# Patient Record
Sex: Male | Born: 1955 | Race: White | Hispanic: No | Smoking: Former smoker
Health system: Southern US, Community
[De-identification: ages and names within clinical notes are randomized; demographics above are authoritative.]

## PROBLEM LIST (undated history)

## (undated) DIAGNOSIS — M1990 Unspecified osteoarthritis, unspecified site: Secondary | ICD-10-CM

## (undated) DIAGNOSIS — I10 Essential (primary) hypertension: Secondary | ICD-10-CM

## (undated) DIAGNOSIS — I5022 Chronic systolic (congestive) heart failure: Secondary | ICD-10-CM

## (undated) DIAGNOSIS — R0600 Dyspnea, unspecified: Secondary | ICD-10-CM

## (undated) DIAGNOSIS — R011 Cardiac murmur, unspecified: Secondary | ICD-10-CM

## (undated) DIAGNOSIS — Z953 Presence of xenogenic heart valve: Secondary | ICD-10-CM

## (undated) DIAGNOSIS — I219 Acute myocardial infarction, unspecified: Secondary | ICD-10-CM

## (undated) DIAGNOSIS — I2510 Atherosclerotic heart disease of native coronary artery without angina pectoris: Secondary | ICD-10-CM

## (undated) DIAGNOSIS — Z87442 Personal history of urinary calculi: Secondary | ICD-10-CM

## (undated) DIAGNOSIS — I350 Nonrheumatic aortic (valve) stenosis: Secondary | ICD-10-CM

## (undated) HISTORY — DX: Chronic systolic (congestive) heart failure: I50.22

## (undated) HISTORY — DX: Acute myocardial infarction, unspecified: I21.9

## (undated) HISTORY — DX: Nonrheumatic aortic (valve) stenosis: I35.0

## (undated) HISTORY — DX: Atherosclerotic heart disease of native coronary artery without angina pectoris: I25.10

---

## 2001-12-04 DIAGNOSIS — I219 Acute myocardial infarction, unspecified: Secondary | ICD-10-CM

## 2001-12-04 HISTORY — PX: CORONARY ANGIOPLASTY: SHX604

## 2001-12-04 HISTORY — DX: Acute myocardial infarction, unspecified: I21.9

## 2002-03-06 ENCOUNTER — Inpatient Hospital Stay (HOSPITAL_COMMUNITY): Admission: EM | Admit: 2002-03-06 | Discharge: 2002-03-09 | Payer: Self-pay | Admitting: Internal Medicine

## 2017-05-02 ENCOUNTER — Encounter: Payer: Self-pay | Admitting: *Deleted

## 2017-05-02 ENCOUNTER — Ambulatory Visit (INDEPENDENT_AMBULATORY_CARE_PROVIDER_SITE_OTHER): Payer: BLUE CROSS/BLUE SHIELD | Admitting: Cardiovascular Disease

## 2017-05-02 ENCOUNTER — Other Ambulatory Visit: Payer: Self-pay | Admitting: Cardiovascular Disease

## 2017-05-02 ENCOUNTER — Encounter: Payer: Self-pay | Admitting: Cardiovascular Disease

## 2017-05-02 ENCOUNTER — Telehealth: Payer: Self-pay | Admitting: Cardiovascular Disease

## 2017-05-02 VITALS — BP 122/86 | HR 93 | Ht 70.0 in | Wt 201.2 lb

## 2017-05-02 DIAGNOSIS — I5022 Chronic systolic (congestive) heart failure: Secondary | ICD-10-CM

## 2017-05-02 DIAGNOSIS — I10 Essential (primary) hypertension: Secondary | ICD-10-CM

## 2017-05-02 DIAGNOSIS — Z01812 Encounter for preprocedural laboratory examination: Secondary | ICD-10-CM

## 2017-05-02 DIAGNOSIS — Z955 Presence of coronary angioplasty implant and graft: Secondary | ICD-10-CM

## 2017-05-02 DIAGNOSIS — I25118 Atherosclerotic heart disease of native coronary artery with other forms of angina pectoris: Secondary | ICD-10-CM | POA: Diagnosis not present

## 2017-05-02 DIAGNOSIS — I350 Nonrheumatic aortic (valve) stenosis: Secondary | ICD-10-CM | POA: Diagnosis not present

## 2017-05-02 MED ORDER — ASPIRIN EC 81 MG PO TBEC: 81.0000 mg | DELAYED_RELEASE_TABLET | Freq: Every day | ORAL | Status: DC

## 2017-05-02 NOTE — Telephone Encounter (Signed)
Wednesday, 6/6 -8:30 - Excell Seltzerooper

## 2017-05-02 NOTE — Patient Instructions (Addendum)
Medication Instructions:   Decrease Aspirin to 81mg  daily.   Continue all other current medications.  Labwork: BMET, CBC, PT, PTT   Testing/Procedures:  A chest x-ray takes a picture of the organs and structures inside the chest, including the heart, lungs, and blood vessels. This test can show several things, including, whether the heart is enlarges; whether fluid is building up in the lungs; and whether pacemaker / defibrillator leads are still in place.  Your physician has requested that you have a cardiac catheterization. Cardiac catheterization is used to diagnose and/or treat various heart conditions. Doctors may recommend this procedure for a number of different reasons. The most common reason is to evaluate chest pain. Chest pain can be a symptom of coronary artery disease (CAD), and cardiac catheterization can show whether plaque is narrowing or blocking your heart's arteries. This procedure is also used to evaluate the valves, as well as measure the blood flow and oxygen levels in different parts of your heart. For further information please visit [email or web address]. Please follow instruction sheet, as given.  Follow-Up: 6 weeks   Any Other Special Instructions Will Be Listed Below (If Applicable). You have been referred to:  [facility] - Dr. Cornelius Moraswen  If you need a refill on your cardiac medications before your next appointment, please call your pharmacy.

## 2017-05-02 NOTE — Progress Notes (Signed)
CARDIOLOGY CONSULT NOTE  Patient ID: Eugene InglesDoyle J Lilley MRN: [id number] DOB/AGE: October 01, 1956 61 y.o.  Admit date: (Not on file) Primary Physician: Richardean Chimeraaniel, Terry G, MD Referring Physician: Reuel Boomaniel  Reason for Consultation: aortic stenosis, CHF, CAD  HPI: Eugene Frazier is a 61 y.o. male who is being seen today for the evaluation of aortic stenosis, CAD, and CHF at the request of Richardean Chimeraaniel, Terry G, MD.   He underwent [facility]17 [facility]. I reviewed the report. It demonstrated moderately reduced left ventricular systolic function, LVEF 30-35%, mild left ventricular dilatation, mild LVH, grade 3 diastolic dysfunction, elevated left ventricular filling pressures, mild left atrial dilatation, moderately reduced right ventricular systolic function, severe bicuspid aortic valve stenosis with no evidence of regurgitation, mean gradient 30 mmHg, valve area 0.5 cm, mild mitral regurgitation, mildly elevated pulmonary pressures of 37 mmHg, with no dilatation of the ascending aorta. This was interpreted by Dr. Joneen Roachody Deen.  ECG performed in the office today which I ordered an personally interpreted demonstrated sinus rhythm with a nonspecific ST segment and T-wave abnormality in leads V5 and V6.  He tells me he had a myocardial infarction and received "2 stents to the back of his heart "about 17 years ago. He does not have a card and I do not have a cath report.  He had not seen his primary care physician for several years but began to experience progressive exertional dyspnea, bilateral leg swelling, and abdominal distention. He then saw Dr. Reuel Boomaniel who evaluated him and ordered the aforementioned echocardiogram. He was then started on Lasix 40 mg daily and is feeling much better.  He says if he walks 50 yards vigorously he has exertional dyspnea. He denies chest pain, lightheadedness, dizziness, and syncope. He also denies orthopnea and paroxysmal nocturnal dyspnea. His primary  complaint is that his legs are itchy with resolution of leg edema.  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  He is reportedly intolerant to statins with significant shoulder weakness and pain and was most recently taking them in 2010.      Allergies  Allergen Reactions  . Penicillins Hives    Current Outpatient Prescriptions  Medication Sig Dispense Refill  . aspirin 325 MG tablet Take 325 mg by mouth daily.    . furosemide (LASIX) 40 MG tablet Take 40 mg by mouth.    . metoprolol succinate (TOPROL-XL) 25 MG 24 hr tablet metoprolol succinate ER 25 mg tablet,extended release 24 hr     No current facility-administered medications for this visit.     Past Medical History:  Diagnosis Date  . Aortic stenosis     Past Surgical History:  Procedure Laterality Date  . CORONARY ANGIOPLASTY      Social History   Social History  . Marital status: Married    Spouse name: N/A  . Number of children: N/A  . Years of education: N/A   Occupational History  . Not on file.   Social History Main Topics  . Smoking status: Former Smoker    Quit date: 05/02/1997  . Smokeless tobacco: Never Used  . Alcohol use Not on file  . Drug use: Unknown  . Sexual activity: Not on file   Other Topics Concern  . Not on file   Social History Narrative  . No narrative on file     No family history of premature CAD in 1st  degree relatives.  Current Meds  Medication Sig  . aspirin 325 MG tablet Take 325 mg by mouth daily.  . furosemide (LASIX) 40 MG tablet Take 40 mg by mouth.  . metoprolol succinate (TOPROL-XL) 25 MG 24 hr tablet metoprolol succinate ER 25 mg tablet,extended release 24 hr      Review of systems complete and found to be negative unless listed above in HPI    Physical exam Blood pressure 122/86, pulse 93, height 5\' 10"  (1.778 m), weight 201 lb 3.2 oz (91.3 kg), SpO2 98 %. General: NAD Neck: JVP 12  cm, no thyromegaly or thyroid nodule.  Lungs: Clear to auscultation bilaterally with normal respiratory effort. CV: Nondisplaced PMI. Regular rate and rhythm, normal S1/S2, no S3/S4, III/VI late peaking ejection systolic murmur.  Trace bilateral pretibial edema.  No carotid bruit.    Abdomen: Soft, nontender, no distention.  Skin: Lower extremity scaling b/l.  Neurologic: Alert and oriented x 3.  Psych: Normal affect. Extremities: No clubbing or cyanosis.  HEENT: Normal.   ECG: Most recent ECG reviewed.   Labs: No results found for: K, BUN, CREATININE, ALT, TSH, HGB   Lipids: No results found for: LDLCALC, LDLDIRECT, CHOL, TRIG, HDL      ASSESSMENT AND PLAN:  1. Severe reportedly bicuspid aortic valve stenosis with moderately reduced left ventricular systolic function, LVEF 30-35%: The aortic valve will need further characterization. He will require right and left heart catheterization which I will arrange, TEE, and will need to be evaluated for valve replacement surgery and CABG only if necessary. I will make a referral to CT surgery (Dr. Cornelius Moras). Risks and benefits of cardiac catheterization have been discussed with the patient.  These include bleeding, infection, kidney damage, stroke, heart attack, death.  The patient understands these risks and is willing to proceed.  2. Chronic systolic heart failure, LVEF 30-35%: He currently appears euvolemic on Lasix 40 mg daily. He is also on metoprolol succinate 25 mg daily. No changes.  3. Coronary artery disease: As he will ultimately require valve replacement surgery if he indeed has severe aortic stenosis, I will arrange for right and left heart catheterization. He is on 325 mg of aspirin which I will reduce to 81 mg for now. Continue metoprolol succinate. He is statin intolerant. Risks and benefits of cardiac catheterization have been discussed with the patient.  These include bleeding, infection, kidney damage, stroke, heart attack, death.   The patient understands these risks and is willing to proceed.  4. Hypertension: Controlled on Toprol-XL. No changes to therapy.   Disposition: Follow up in 6 weeks.  Signed: Prentice Docker, M.D., F.A.C.C.  05/02/2017, 9:21 AM

## 2017-05-02 NOTE — Telephone Encounter (Signed)
Right & Left heart cath

## 2017-05-03 ENCOUNTER — Ambulatory Visit (HOSPITAL_COMMUNITY)
Admission: RE | Admit: 2017-05-03 | Discharge: 2017-05-03 | Disposition: A | Discharge status: Home / Self Care | Payer: BLUE CROSS/BLUE SHIELD | Source: Ambulatory Visit | Attending: Cardiovascular Disease | Admitting: Cardiovascular Disease

## 2017-05-03 ENCOUNTER — Other Ambulatory Visit (HOSPITAL_COMMUNITY)
Admission: RE | Admit: 2017-05-03 | Discharge: 2017-05-03 | Disposition: A | Discharge status: Home / Self Care | Payer: BLUE CROSS/BLUE SHIELD | Source: Ambulatory Visit | Attending: Cardiovascular Disease | Admitting: Cardiovascular Disease

## 2017-05-03 DIAGNOSIS — I350 Nonrheumatic aortic (valve) stenosis: Secondary | ICD-10-CM | POA: Insufficient documentation

## 2017-05-03 DIAGNOSIS — I25118 Atherosclerotic heart disease of native coronary artery with other forms of angina pectoris: Secondary | ICD-10-CM | POA: Diagnosis present

## 2017-05-03 DIAGNOSIS — R911 Solitary pulmonary nodule: Secondary | ICD-10-CM | POA: Insufficient documentation

## 2017-05-03 DIAGNOSIS — Z01812 Encounter for preprocedural laboratory examination: Secondary | ICD-10-CM | POA: Insufficient documentation

## 2017-05-03 LAB — PROTIME-INR
INR: 1.15
Prothrombin Time: 14.8 seconds (ref 11.4–15.2)

## 2017-05-03 LAB — CBC
HEMATOCRIT: 45.5 % (ref 39.0–52.0)
Hemoglobin: 15.1 g/dL (ref 13.0–17.0)
MCH: 29.2 pg (ref 26.0–34.0)
MCHC: 33.2 g/dL (ref 30.0–36.0)
MCV: 88 fL (ref 78.0–100.0)
Platelets: 229 10*3/uL (ref 150–400)
RBC: 5.17 MIL/uL (ref 4.22–5.81)
RDW: 16 % — AB (ref 11.5–15.5)
WBC: 7.6 10*3/uL (ref 4.0–10.5)

## 2017-05-03 LAB — BASIC METABOLIC PANEL
Anion gap: 9 (ref 5–15)
BUN: 16 mg/dL (ref 6–20)
CHLORIDE: 102 mmol/L (ref 101–111)
CO2: 27 mmol/L (ref 22–32)
CREATININE: 1.26 mg/dL — AB (ref 0.61–1.24)
Calcium: 9.5 mg/dL (ref 8.9–10.3)
GFR calc Af Amer: >60 mL/min (ref >60)
GFR calc non Af Amer: >60 mL/min (ref >60)
GLUCOSE: 96 mg/dL (ref 65–99)
POTASSIUM: 3.8 mmol/L (ref 3.5–5.1)
SODIUM: 138 mmol/L (ref 135–145)

## 2017-05-03 LAB — APTT: APTT: 27 s (ref 24–36)

## 2017-05-07 ENCOUNTER — Telehealth: Payer: Self-pay | Admitting: *Deleted

## 2017-05-07 NOTE — Telephone Encounter (Signed)
Notes recorded by Lesle ChrisHill, Angela G, LPN on 1/6/10966/03/2017 at 8:41 AM EDT Patient notified. Copy to pmd. (pre-cath)

## 2017-05-07 NOTE — Telephone Encounter (Signed)
CHEST X-RAY Notes recorded by Laqueta LindenKoneswaran, Suresh A, MD on 05/04/2017 at 9:23 AM EDT No infection or fluid.

## 2017-05-07 NOTE — Telephone Encounter (Signed)
LABS -  Notes recorded by Laqueta LindenKoneswaran, Suresh A, MD on 05/04/2017 at 9:24 AM EDT Creatinine very Mildly elevated. Have PCP follow.

## 2017-05-08 ENCOUNTER — Telehealth: Payer: Self-pay

## 2017-05-08 NOTE — Telephone Encounter (Signed)
Call placed to Pt to confirm pre cath instruction.  Left generic VM requesting call back.  Await return call.

## 2017-05-09 ENCOUNTER — Ambulatory Visit (HOSPITAL_BASED_OUTPATIENT_CLINIC_OR_DEPARTMENT_OTHER): Payer: BLUE CROSS/BLUE SHIELD

## 2017-05-09 ENCOUNTER — Ambulatory Visit (HOSPITAL_COMMUNITY)
Admission: RE | Admit: 2017-05-09 | Discharge: 2017-05-09 | Disposition: A | Discharge status: Home / Self Care | Payer: BLUE CROSS/BLUE SHIELD | Source: Ambulatory Visit | Attending: Cardiovascular Disease | Admitting: Cardiovascular Disease

## 2017-05-09 ENCOUNTER — Encounter (HOSPITAL_COMMUNITY)
Admission: RE | Disposition: A | Discharge status: Home / Self Care | Payer: Self-pay | Source: Ambulatory Visit | Attending: Cardiovascular Disease

## 2017-05-09 DIAGNOSIS — I2510 Atherosclerotic heart disease of native coronary artery without angina pectoris: Secondary | ICD-10-CM | POA: Diagnosis not present

## 2017-05-09 DIAGNOSIS — Z87891 Personal history of nicotine dependence: Secondary | ICD-10-CM | POA: Insufficient documentation

## 2017-05-09 DIAGNOSIS — I110 Hypertensive heart disease with heart failure: Secondary | ICD-10-CM | POA: Diagnosis not present

## 2017-05-09 DIAGNOSIS — Q231 Congenital insufficiency of aortic valve: Secondary | ICD-10-CM | POA: Diagnosis not present

## 2017-05-09 DIAGNOSIS — I350 Nonrheumatic aortic (valve) stenosis: Secondary | ICD-10-CM | POA: Diagnosis not present

## 2017-05-09 DIAGNOSIS — I252 Old myocardial infarction: Secondary | ICD-10-CM | POA: Diagnosis not present

## 2017-05-09 DIAGNOSIS — I5022 Chronic systolic (congestive) heart failure: Secondary | ICD-10-CM | POA: Insufficient documentation

## 2017-05-09 DIAGNOSIS — I272 Pulmonary hypertension, unspecified: Secondary | ICD-10-CM | POA: Insufficient documentation

## 2017-05-09 DIAGNOSIS — I25118 Atherosclerotic heart disease of native coronary artery with other forms of angina pectoris: Secondary | ICD-10-CM

## 2017-05-09 DIAGNOSIS — Z79899 Other long term (current) drug therapy: Secondary | ICD-10-CM | POA: Insufficient documentation

## 2017-05-09 DIAGNOSIS — Z7982 Long term (current) use of aspirin: Secondary | ICD-10-CM | POA: Diagnosis not present

## 2017-05-09 DIAGNOSIS — I429 Cardiomyopathy, unspecified: Secondary | ICD-10-CM | POA: Diagnosis not present

## 2017-05-09 DIAGNOSIS — I340 Nonrheumatic mitral (valve) insufficiency: Secondary | ICD-10-CM

## 2017-05-09 DIAGNOSIS — Z880 Allergy status to penicillin: Secondary | ICD-10-CM | POA: Insufficient documentation

## 2017-05-09 DIAGNOSIS — Z9861 Coronary angioplasty status: Secondary | ICD-10-CM | POA: Diagnosis not present

## 2017-05-09 HISTORY — PX: RIGHT/LEFT HEART CATH AND CORONARY ANGIOGRAPHY: CATH118266

## 2017-05-09 LAB — POCT I-STAT 3, VENOUS BLOOD GAS (G3P V)
Acid-Base Excess: 1 mmol/L (ref 0.0–2.0)
BICARBONATE: 26.1 mmol/L (ref 20.0–28.0)
O2 Saturation: 69 %
PH VEN: 7.398 (ref 7.250–7.430)
TCO2: 27 mmol/L (ref 0–100)
pCO2, Ven: 42.2 mmHg — ABNORMAL LOW (ref 44.0–60.0)
pO2, Ven: 36 mmHg (ref 32.0–45.0)

## 2017-05-09 LAB — POCT I-STAT 3, ART BLOOD GAS (G3+)
Bicarbonate: 25.8 mmol/L (ref 20.0–28.0)
O2 SAT: 98 %
PCO2 ART: 44.9 mmHg (ref 32.0–48.0)
TCO2: 27 mmol/L (ref 0–100)
pH, Arterial: 7.367 (ref 7.350–7.450)
pO2, Arterial: 116 mmHg — ABNORMAL HIGH (ref 83.0–108.0)

## 2017-05-09 SURGERY — RIGHT/LEFT HEART CATH AND CORONARY ANGIOGRAPHY
Anesthesia: LOCAL

## 2017-05-09 MED ORDER — HEPARIN (PORCINE) IN NACL 2-0.9 UNIT/ML-% IJ SOLN
INTRAMUSCULAR | Status: AC | PRN
  Administered 2017-05-09: 1500 mL

## 2017-05-09 MED ORDER — ACETAMINOPHEN 325 MG PO TABS: 650.0000 mg | ORAL_TABLET | ORAL | Status: DC | PRN

## 2017-05-09 MED ORDER — HEPARIN SODIUM (PORCINE) 1000 UNIT/ML IJ SOLN
INTRAMUSCULAR | Status: AC
  Filled 2017-05-09: qty 1

## 2017-05-09 MED ORDER — IOPAMIDOL (ISOVUE-370) INJECTION 76%
INTRAVENOUS | Status: AC
  Filled 2017-05-09: qty 100

## 2017-05-09 MED ORDER — ASPIRIN 81 MG PO CHEW
81.0000 mg | CHEWABLE_TABLET | ORAL | Status: AC
  Administered 2017-05-09: 81 mg via ORAL

## 2017-05-09 MED ORDER — SODIUM CHLORIDE 0.9% FLUSH: 3.0000 mL | INTRAVENOUS | Status: DC | PRN

## 2017-05-09 MED ORDER — SODIUM CHLORIDE 0.9% FLUSH: 3.0000 mL | Freq: Two times a day (BID) | INTRAVENOUS | Status: DC

## 2017-05-09 MED ORDER — HEPARIN SODIUM (PORCINE) 1000 UNIT/ML IJ SOLN
INTRAMUSCULAR | Status: DC | PRN
  Administered 2017-05-09: 5000 [IU] via INTRAVENOUS

## 2017-05-09 MED ORDER — VERAPAMIL HCL 2.5 MG/ML IV SOLN
INTRAVENOUS | Status: AC
  Filled 2017-05-09: qty 2

## 2017-05-09 MED ORDER — IOPAMIDOL (ISOVUE-370) INJECTION 76%
INTRAVENOUS | Status: DC | PRN
  Administered 2017-05-09: 90 mL via INTRA_ARTERIAL

## 2017-05-09 MED ORDER — LIDOCAINE HCL 1 % IJ SOLN
INTRAMUSCULAR | Status: AC
  Filled 2017-05-09: qty 20

## 2017-05-09 MED ORDER — MIDAZOLAM HCL 2 MG/2ML IJ SOLN
INTRAMUSCULAR | Status: AC
  Filled 2017-05-09: qty 2

## 2017-05-09 MED ORDER — SODIUM CHLORIDE 0.9 % WEIGHT BASED INFUSION
3.0000 mL/kg/h | INTRAVENOUS | Status: DC
  Administered 2017-05-09: 3 mL/kg/h via INTRAVENOUS

## 2017-05-09 MED ORDER — FENTANYL CITRATE (PF) 100 MCG/2ML IJ SOLN
INTRAMUSCULAR | Status: AC
  Filled 2017-05-09: qty 2

## 2017-05-09 MED ORDER — MIDAZOLAM HCL 2 MG/2ML IJ SOLN
INTRAMUSCULAR | Status: DC | PRN
  Administered 2017-05-09: 1 mg via INTRAVENOUS

## 2017-05-09 MED ORDER — HEPARIN (PORCINE) IN NACL 2-0.9 UNIT/ML-% IJ SOLN
INTRAMUSCULAR | Status: DC | PRN
  Administered 2017-05-09: 10 mL via INTRA_ARTERIAL

## 2017-05-09 MED ORDER — ASPIRIN 81 MG PO CHEW
CHEWABLE_TABLET | ORAL | Status: AC
  Administered 2017-05-09: 81 mg via ORAL
  Filled 2017-05-09: qty 1

## 2017-05-09 MED ORDER — SODIUM CHLORIDE 0.9 % WEIGHT BASED INFUSION
1.0000 mL/kg/h | INTRAVENOUS | Status: DC
  Administered 2017-05-09: 1 mL/kg/h via INTRAVENOUS

## 2017-05-09 MED ORDER — FENTANYL CITRATE (PF) 100 MCG/2ML IJ SOLN
INTRAMUSCULAR | Status: DC | PRN
  Administered 2017-05-09: 25 ug via INTRAVENOUS

## 2017-05-09 MED ORDER — SODIUM CHLORIDE 0.9 % IV SOLN: 250.0000 mL | INTRAVENOUS | Status: DC | PRN

## 2017-05-09 MED ORDER — ONDANSETRON HCL 4 MG/2ML IJ SOLN
4.0000 mg | Freq: Four times a day (QID) | INTRAMUSCULAR | Status: DC | PRN
Start: 2017-05-09 — End: 2017-05-09

## 2017-05-09 MED ORDER — LIDOCAINE HCL (PF) 1 % IJ SOLN
INTRAMUSCULAR | Status: DC | PRN
  Administered 2017-05-09: 1 mL
  Administered 2017-05-09: 2 mL

## 2017-05-09 SURGICAL SUPPLY — 16 items
CATH BALLN WEDGE 5F 110CM (CATHETERS) ×1 IMPLANT
CATH INFINITI 5 FR JL3.5 (CATHETERS) ×1 IMPLANT
CATH INFINITI 5FR AL1 (CATHETERS) ×1 IMPLANT
CATH INFINITI 5FR ANG PIGTAIL (CATHETERS) ×1 IMPLANT
CATH INFINITI 5FR JL4 (CATHETERS) ×1 IMPLANT
CATH INFINITI JR4 5F (CATHETERS) ×1 IMPLANT
GLIDESHEATH SLEND SS 6F .021 (SHEATH) ×1 IMPLANT
GUIDEWIRE INQWIRE 1.5J.035X260 (WIRE) IMPLANT
INQWIRE 1.5J .035X260CM (WIRE) ×2
KIT HEART LEFT (KITS) ×2 IMPLANT
PACK CARDIAC CATHETERIZATION (CUSTOM PROCEDURE TRAY) ×2 IMPLANT
SHEATH GLIDE SLENDER 4/5FR (SHEATH) ×1 IMPLANT
SYR MEDRAD MARK V 150ML (SYRINGE) ×2 IMPLANT
TRANSDUCER W/STOPCOCK (MISCELLANEOUS) ×2 IMPLANT
TUBING CIL FLEX 10 FLL-RA (TUBING) ×2 IMPLANT
WIRE EMERALD ST .035X150CM (WIRE) ×1 IMPLANT

## 2017-05-09 NOTE — Progress Notes (Signed)
Echo completed. Radial and brachial sites WNL. VSS. Discharged to home with friend.  Office to call pt with instructions for cardiac CT

## 2017-05-09 NOTE — Progress Notes (Signed)
Pt ok to discharge. Waiting on ECHO. Echo was called to confirm it to be done before pt s discharge

## 2017-05-09 NOTE — H&P (View-Only) (Signed)
     CARDIOLOGY CONSULT NOTE  Patient ID: Eugene Frazier MRN: [id number] DOB/AGE: 61/13/1957 60 y.o.  Admit date: (Not on file) Primary Physician: Daniel, Terry G, MD Referring Physician: Daniel  Reason for Consultation: aortic stenosis, CHF, CAD  HPI: Eugene Frazier is a 60 y.o. male who is being seen today for the evaluation of aortic stenosis, CAD, and CHF at the request of Daniel, Terry G, MD.   He underwent an echocardiogram on 04/11/17 at [facility]. I reviewed the report. It demonstrated moderately reduced left ventricular systolic function, LVEF 30-35%, mild left ventricular dilatation, mild LVH, grade 3 diastolic dysfunction, elevated left ventricular filling pressures, mild left atrial dilatation, moderately reduced right ventricular systolic function, severe bicuspid aortic valve stenosis with no evidence of regurgitation, mean gradient 30 mmHg, valve area 0.5 cm, mild mitral regurgitation, mildly elevated pulmonary pressures of 37 mmHg, with no dilatation of the ascending aorta. This was interpreted by Dr. Cody Deen.  ECG performed in the office today which I ordered an personally interpreted demonstrated sinus rhythm with a nonspecific ST segment and T-wave abnormality in leads V5 and V6.  He tells me he had a myocardial infarction and received "2 stents to the back of his heart "about 17 years ago. He does not have a card and I do not have a cath report.  He had not seen his primary care physician for several years but began to experience progressive exertional dyspnea, bilateral leg swelling, and abdominal distention. He then saw Dr. Daniel who evaluated him and ordered the aforementioned echocardiogram. He was then started on Lasix 40 mg daily and is feeling much better.  He says if he walks 50 yards vigorously he has exertional dyspnea. He denies chest pain, lightheadedness, dizziness, and syncope. He also denies orthopnea and paroxysmal nocturnal dyspnea. His primary  complaint is that his legs are itchy with resolution of leg edema.  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  He is reportedly intolerant to statins with significant shoulder weakness and pain and was most recently taking them in 2010.      Allergies  Allergen Reactions  . Penicillins Hives    Current Outpatient Prescriptions  Medication Sig Dispense Refill  . aspirin 325 MG tablet Take 325 mg by mouth daily.    . furosemide (LASIX) 40 MG tablet Take 40 mg by mouth.    . metoprolol succinate (TOPROL-XL) 25 MG 24 hr tablet metoprolol succinate ER 25 mg tablet,extended release 24 hr     No current facility-administered medications for this visit.     Past Medical History:  Diagnosis Date  . Aortic stenosis     Past Surgical History:  Procedure Laterality Date  . CORONARY ANGIOPLASTY      Social History   Social History  . Marital status: Married    Spouse name: N/A  . Number of children: N/A  . Years of education: N/A   Occupational History  . Not on file.   Social History Main Topics  . Smoking status: Former Smoker    Quit date: 05/02/1997  . Smokeless tobacco: Never Used  . Alcohol use Not on file  . Drug use: Unknown  . Sexual activity: Not on file   Other Topics Concern  . Not on file   Social History Narrative  . No narrative on file     No family history of premature CAD in 1st   degree relatives.  Current Meds  Medication Sig  . aspirin 325 MG tablet Take 325 mg by mouth daily.  . furosemide (LASIX) 40 MG tablet Take 40 mg by mouth.  . metoprolol succinate (TOPROL-XL) 25 MG 24 hr tablet metoprolol succinate ER 25 mg tablet,extended release 24 hr      Review of systems complete and found to be negative unless listed above in HPI    Physical exam Blood pressure 122/86, pulse 93, height 5' 10" (1.778 m), weight 201 lb 3.2 oz (91.3 kg), SpO2 98 %. General: NAD Neck: JVP 12  cm, no thyromegaly or thyroid nodule.  Lungs: Clear to auscultation bilaterally with normal respiratory effort. CV: Nondisplaced PMI. Regular rate and rhythm, normal S1/S2, no S3/S4, III/VI late peaking ejection systolic murmur.  Trace bilateral pretibial edema.  No carotid bruit.    Abdomen: Soft, nontender, no distention.  Skin: Lower extremity scaling b/l.  Neurologic: Alert and oriented x 3.  Psych: Normal affect. Extremities: No clubbing or cyanosis.  HEENT: Normal.   ECG: Most recent ECG reviewed.   Labs: No results found for: K, BUN, CREATININE, ALT, TSH, HGB   Lipids: No results found for: LDLCALC, LDLDIRECT, CHOL, TRIG, HDL      ASSESSMENT AND PLAN:  1. Severe reportedly bicuspid aortic valve stenosis with moderately reduced left ventricular systolic function, LVEF 30-35%: The aortic valve will need further characterization. He will require right and left heart catheterization which I will arrange, TEE, and will need to be evaluated for valve replacement surgery and CABG only if necessary. I will make a referral to CT surgery (Dr. Owen). Risks and benefits of cardiac catheterization have been discussed with the patient.  These include bleeding, infection, kidney damage, stroke, heart attack, death.  The patient understands these risks and is willing to proceed.  2. Chronic systolic heart failure, LVEF 30-35%: He currently appears euvolemic on Lasix 40 mg daily. He is also on metoprolol succinate 25 mg daily. No changes.  3. Coronary artery disease: As he will ultimately require valve replacement surgery if he indeed has severe aortic stenosis, I will arrange for right and left heart catheterization. He is on 325 mg of aspirin which I will reduce to 81 mg for now. Continue metoprolol succinate. He is statin intolerant. Risks and benefits of cardiac catheterization have been discussed with the patient.  These include bleeding, infection, kidney damage, stroke, heart attack, death.   The patient understands these risks and is willing to proceed.  4. Hypertension: Controlled on Toprol-XL. No changes to therapy.   Disposition: Follow up in 6 weeks.  Signed: Taneah Masri, M.D., F.A.C.C.  05/02/2017, 9:21 AM   

## 2017-05-09 NOTE — Interval H&P Note (Signed)
History and Physical Interval Note:  05/09/2017 7:46 AM  Eugene Lawrenceoyle [name]  has presented today for surgery, with the diagnosis of arotic stenosis  The various methods of treatment have been discussed with the patient and family. After consideration of risks, benefits and other options for treatment, the patient has consented to  Procedure(s): Right/Left Heart Cath and Coronary Angiography (N/A) as a surgical intervention .  The patient's history has been reviewed, patient examined, no change in status, stable for surgery.  I have reviewed the patient's chart and labs.  Questions were answered to the patient's satisfaction.     Tonny Bollmanooper, Mariya Mottley

## 2017-05-09 NOTE — Discharge Instructions (Signed)

## 2017-05-10 ENCOUNTER — Encounter (HOSPITAL_COMMUNITY): Payer: Self-pay | Admitting: Cardiovascular Disease

## 2017-05-10 LAB — ECHOCARDIOGRAM COMPLETE
HEIGHTINCHES: 70 in
WEIGHTICAEL: 3200 [oz_av]

## 2017-05-11 ENCOUNTER — Other Ambulatory Visit: Payer: Self-pay | Admitting: *Deleted

## 2017-05-11 DIAGNOSIS — I350 Nonrheumatic aortic (valve) stenosis: Secondary | ICD-10-CM

## 2017-05-15 ENCOUNTER — Ambulatory Visit (HOSPITAL_COMMUNITY): Admit: 2017-05-15 | Payer: BLUE CROSS/BLUE SHIELD | Admitting: Cardiovascular Disease

## 2017-05-15 ENCOUNTER — Other Ambulatory Visit (HOSPITAL_COMMUNITY): Payer: BLUE CROSS/BLUE SHIELD

## 2017-05-15 ENCOUNTER — Encounter (HOSPITAL_COMMUNITY): Payer: Self-pay

## 2017-05-15 SURGERY — ECHOCARDIOGRAM, TRANSESOPHAGEAL
Anesthesia: Moderate Sedation

## 2017-05-17 ENCOUNTER — Encounter (HOSPITAL_COMMUNITY): Payer: Self-pay

## 2017-05-17 ENCOUNTER — Ambulatory Visit (HOSPITAL_COMMUNITY)
Admission: RE | Admit: 2017-05-17 | Discharge: 2017-05-17 | Disposition: A | Discharge status: Home / Self Care | Payer: BLUE CROSS/BLUE SHIELD | Source: Ambulatory Visit | Attending: Cardiovascular Disease | Admitting: Cardiovascular Disease

## 2017-05-17 DIAGNOSIS — I700 Atherosclerosis of aorta: Secondary | ICD-10-CM | POA: Diagnosis not present

## 2017-05-17 DIAGNOSIS — I350 Nonrheumatic aortic (valve) stenosis: Secondary | ICD-10-CM

## 2017-05-17 MED ORDER — METOPROLOL TARTRATE 5 MG/5ML IV SOLN
5.0000 mg | INTRAVENOUS | Status: DC | PRN
  Administered 2017-05-17: 5 mg via INTRAVENOUS

## 2017-05-17 MED ORDER — METOPROLOL TARTRATE 5 MG/5ML IV SOLN
INTRAVENOUS | Status: AC
  Administered 2017-05-17: 5 mg via INTRAVENOUS
  Filled 2017-05-17: qty 20

## 2017-05-17 MED ORDER — IOPAMIDOL (ISOVUE-370) INJECTION 76%
INTRAVENOUS | Status: AC
  Administered 2017-05-17: 100 mL
  Filled 2017-05-17: qty 100

## 2017-05-21 ENCOUNTER — Encounter: Payer: Self-pay | Admitting: Thoracic Surgery (Cardiothoracic Vascular Surgery)

## 2017-05-21 ENCOUNTER — Institutional Professional Consult (permissible substitution) (INDEPENDENT_AMBULATORY_CARE_PROVIDER_SITE_OTHER): Payer: BLUE CROSS/BLUE SHIELD | Admitting: Thoracic Surgery (Cardiothoracic Vascular Surgery)

## 2017-05-21 VITALS — BP 112/72 | HR 88 | Resp 20 | Ht 70.0 in | Wt 196.0 lb

## 2017-05-21 DIAGNOSIS — I5022 Chronic systolic (congestive) heart failure: Secondary | ICD-10-CM | POA: Diagnosis not present

## 2017-05-21 DIAGNOSIS — I2510 Atherosclerotic heart disease of native coronary artery without angina pectoris: Secondary | ICD-10-CM

## 2017-05-21 DIAGNOSIS — I350 Nonrheumatic aortic (valve) stenosis: Secondary | ICD-10-CM | POA: Diagnosis not present

## 2017-05-21 NOTE — Progress Notes (Addendum)
[facility]  MULTIDISCIPLINARY HEART VALVE CLINIC  CARDIOTHORACIC SURGERY CONSULTATION REPORT  Referring Provider is Laqueta Linden, MD PCP is Richardean Chimera, MD  Chief Complaint  Patient presents with  . Aortic Stenosis    Surgical eval, Cardiac Cath  and ECHO 05/09/17, Heart CT 05/17/2017    HPI:  Patient is a 61 year old male with history of aortic stenosis, coronary artery disease status post myocardial infarction in 2003 treated with PCI and stenting, and chronic systolic congestive heart failure who has been referred for surgical consultation to discuss treatment options for management of severe symptomatic aortic stenosis and coronary artery disease. The patient states that he developed acute shortness of breath in 2003 and was diagnosed with an acute myocardial infarction. He was treated with PCI and stenting involving "the arteries on the backside of my heart" at [facility]. Details of that procedure are not available. The patient recovered quickly and has not had any further cardiac problems until recently. Beginning last December the patient began to experience progressive symptoms of exertional shortness of breath, orthopnea, and severe lower extremity edema as well as abdominal swelling with scrotal edema. He eventually reestablish care with Dr. Reuel Boom who noted the presence of a systolic murmur on physical exam, diagnosed him with acute on chronic congestive heart failure and started the patient on Lasix. An echocardiogram was performed in early May that reportedly demonstrated the presence of moderate to severe left ventricular systolic dysfunction with ejection fraction estimated 30-35%, bicuspid aortic valve with severe aortic stenosis, mild mitral regurgitation, and mild pulmonary hypertension. The patient's symptoms improved considerably and he lost more than 50 pounds in weight. He was referred for cardiology consultation and evaluated by Dr.  Purvis Sheffield on 05/02/2017.  Repeat echocardiogram performed 05/09/2017 confirmed the presence of severe left ventricular systolic dysfunction and severe aortic stenosis. Left ventricular ejection fraction was estimated 25-30%. Peak velocity across the aortic valve measured greater than 3.9 m/s corresponding to mean transvalvular gradient estimated 41 mmHg. There was mild mitral regurgitation and moderate pulmonary hypertension.  Diagnostic cardiac catheterization was performed 05/09/2017 by Dr. Excell Seltzer.  The patient was noted to have severe single-vessel coronary artery disease involving the right coronary artery, mild nonobstructive disease in the left anterior descending coronary artery and left circumflex coronary artery, and severe aortic stenosis with mean transvalvular gradient measured 37 mmHg corresponding to aortic valve area calculated 0.68 cm. There was severe global left ventricular systolic dysfunction with ejection fraction estimated less than 30%. Cardiac gated CT angiogram of the heart was performed and cardiothoracic surgical consultation was requested.  The patient is single and lives alone in [place]. He is accompanied for his office visit by her close friend. He previously worked for [employer] but more recently works Engineer, water. He has remained physically active and without any significant physical limitations until recently. He states that he experienced progressive symptoms of exertional shortness of breath and fatigue that became noticeable back in December and ultimately prompted his presentation with severe symptoms of exertional shortness of breath, bilateral leg swelling, orthopnea, and abdominal distention. He is doing much better on medical therapy but he still gets short of breath and tired with exertion. He has not had any chest pain or chest tightness either with activity or at rest. He denies any dizzy spells or syncope.   Past Medical History:    Diagnosis Date  . Aortic stenosis   . Chronic systolic (congestive) heart failure (HCC)   .  Coronary artery disease involving native coronary artery of native heart without angina pectoris   . Myocardial infarction [facility]) 2003    Past Surgical History:  Procedure Laterality Date  . CORONARY ANGIOPLASTY  2003  . RIGHT/LEFT HEART CATH AND CORONARY ANGIOGRAPHY N/A 05/09/2017   Procedure: Right/Left Heart Cath and Coronary Angiography;  Surgeon: Tonny Bollman, MD;  Location: [facility] INVASIVE CV LAB;  Service: Cardiovascular;  Laterality: N/A;    Family History  Problem Relation Age of Onset  . Alzheimer's disease Mother   . Lung disease Father     Social History   Social History  . Marital status: Single    Spouse name: N/A  . Number of children: N/A  . Years of education: N/A   Occupational History  . Not on file.   Social History Main Topics  . Smoking status: Former Smoker    Quit date: 05/02/1997  . Smokeless tobacco: Never Used  . Alcohol use Not on file  . Drug use: Unknown  . Sexual activity: Not on file   Other Topics Concern  . Not on file   Social History Narrative  . No narrative on file    Current Outpatient Prescriptions  Medication Sig Dispense Refill  . aspirin EC 81 MG tablet Take 1 tablet (81 mg total) by mouth daily.    . diphenhydrAMINE (BENADRYL) 25 MG tablet Take 25 mg by mouth every 6 (six) hours as needed for itching.    . furosemide (LASIX) 40 MG tablet Take 40 mg by mouth daily.     Marland Kitchen Ketotifen Fumarate (ALAWAY OP) Apply 1 drop to eye daily as needed (allergies).    . metoprolol succinate (TOPROL-XL) 25 MG 24 hr tablet Take 25mg s by mouth once daily     No current facility-administered medications for this visit.     Allergies  Allergen Reactions  . Penicillins Hives    Fever over 104  Has patient had a PCN reaction causing immediate rash, facial/tongue/throat swelling, SOB or lightheadedness with hypotension: No Has patient had a PCN  reaction causing severe rash involving mucus membranes or skin necrosis: Yes Has patient had a PCN reaction that required hospitalization: No Has patient had a PCN reaction occurring within the last 10 years: No If all of the above answers are "NO", then may proceed with Cephalosporin use.       Review of Systems:   General:  decreased appetite, decreased energy, no weight gain, + weight loss, no fever  Cardiac:  no chest pain with exertion, no chest pain at rest, + SOB with exertion, no resting SOB, no PND, + orthopnea, no palpitations, no arrhythmia, no atrial fibrillation, + LE edema, no dizzy spells, no syncope  Respiratory:  + shortness of breath, no home oxygen, + productive cough, no dry cough, no bronchitis, no wheezing, no hemoptysis, no asthma, no pain with inspiration or cough, no sleep apnea, no CPAP at night  GI:   no difficulty swallowing, no reflux, no frequent heartburn, no hiatal hernia, no abdominal pain, no constipation, no diarrhea, no hematochezia, no hematemesis, no melena  GU:   no dysuria,   frequency, no urinary tract infection, no hematuria, no enlarged prostate, + kidney stones, no kidney disease  Vascular:  no pain suggestive of claudication, no pain in feet, no leg cramps, + varicose veins, no DVT, no non-healing foot ulcer  Neuro:   no stroke, no TIA's, no seizures, no headaches, no temporary blindness one eye,  no slurred speech, +  peripheral neuropathy, no chronic pain, no instability of gait, no memory/cognitive dysfunction  Musculoskeletal: + mild arthritis, no joint swelling, no myalgias, no difficulty walking, normal mobility   Skin:   + rash, + itching, no skin infections, no pressure sores or ulcerations  Psych:   + anxiety, + depression, no nervousness, no unusual recent stress  Eyes:   no blurry vision, no floaters, no recent vision changes, + wears glasses or contacts  ENT:   + hearing loss, + loose or painful teeth, + dentures, last saw dentist many  years ago  Hematologic:  + easy bruising, no abnormal bleeding, no clotting disorder, no frequent epistaxis  Endocrine:  no diabetes, does not check CBG's at home           Physical Exam:   BP 112/72   Pulse 88   Resp 20   Ht 5\' 10"  (1.778 m)   Wt 196 lb (88.9 kg)   SpO2 97% Comment: RA  BMI 28.12 kg/m   General:    well-appearing  HEENT:  Unremarkable   Neck:   no JVD, no bruits, no adenopathy   Chest:   clear to auscultation, symmetrical breath sounds, no wheezes, no rhonchi   CV:   RRR, grade III/VI crescendo/decrescendo murmur heard best at LLSB,  no diastolic murmur  Abdomen:  soft, non-tender, no masses   Extremities:  warm, well-perfused, pulses palpable, no LE edema  Rectal/GU  Deferred  Neuro:   Grossly non-focal and symmetrical throughout  Skin:   Clean and dry, no rashes, no breakdown   Diagnostic Tests:  Transthoracic Echocardiography  Patient:    Hollie, Eugene Frazier MR #:       [id number] Study Date: 05/09/2017 Gender:     M Age:        60 Height:     177.8 cm Weight:     90.7 kg BSA:        2.14 m^2 Pt. Status: Room:       [facility]     Tonny Bollman, MD  REFERRING    Tonny Bollman, MD  PERFORMING   [facility], Inpatient  SONOGRAPHER  Leta Jungling, RDCS  cc:  ------------------------------------------------------------------- LV EF: 25% -   30%  ------------------------------------------------------------------- Indications:      Aortic stenosis 424.1.  ------------------------------------------------------------------- History:   PMH:   Coronary artery disease.  Congestive heart failure.  ------------------------------------------------------------------- Study Conclusions  - Left ventricle: The cavity size was normal. Wall thickness was   normal. Systolic function was severely reduced. The estimated   ejection fraction was in the range of 25% to 30%. Diffuse   hypokinesis. Doppler parameters are consistent with restrictive    physiology, indicative of decreased left ventricular diastolic   compliance and/or increased left atrial pressure. E/medial e&' >   15, suggesting LVEDP at least 20 mmHg. - Aortic valve: Trileaflet; severely calcified leaflets. There was   severe stenosis. Mean gradient (S): 41 mm Hg. Peak gradient (S):   62 mm Hg. Valve area (VTI): 0.85 cm^2. - Mitral valve: There was mild regurgitation. - Left atrium: The atrium was mildly dilated. - Right ventricle: The cavity size was normal. Systolic function   was moderately reduced. - Right atrium: The atrium was mildly dilated. - Pulmonary arteries: PA peak pressure: 49 mm Hg (S). - Systemic veins: IVC measured 2.3 cm with < 50% respirophasic   variation, suggesting RA pressure 15 mmHg.  Impressions:  - Normal LV size with EF 25-30%, diffuse hypokinesis.  Restrictive   diastolic function suggestive of elevated LV filling pressure.   Normal RV size with mild to moderately decreased systolic   function. Severe aortic stenosis. Mild mitral regurgitation.   Moderate pulmonary hypertension. Dilated IVC suggestive of   elevated RV filling pressure.  ------------------------------------------------------------------- Study data:  Comparison was made to the study of 02/15/2017.  Study status:  Routine.  Procedure:  The patient reported no pain pre or post test. Transthoracic echocardiography. Image quality was adequate.  Study completion:  There were no complications. Transthoracic echocardiography.  M-mode, complete 2D, spectral Doppler, and color Doppler.  Birthdate:  Patient birthdate: [birth year].  Age:  Patient is 60 yr old.  Sex:  Gender: male. BMI: 28.7 kg/m^2.  Blood pressure:     151/49  Patient status: Inpatient.  Study date:  Study date: 05/09/2017. Study time: 01:40 PM.  Location:   Bedside.  -------------------------------------------------------------------  ------------------------------------------------------------------- Left ventricle:  The cavity size was normal. Wall thickness was normal. Systolic function was severely reduced. The estimated ejection fraction was in the range of 25% to 30%. Diffuse hypokinesis. Doppler parameters are consistent with restrictive physiology, indicative of decreased left ventricular diastolic compliance and/or increased left atrial pressure. E/medial e&' > 15, suggesting LVEDP at least 20 mmHg.  ------------------------------------------------------------------- Aortic valve:   Trileaflet; severely calcified leaflets.  Doppler:  There was severe stenosis.   There was no regurgitation.    VTI ratio of LVOT to aortic valve: 0.2. Valve area (VTI): 0.85 cm^2. Indexed valve area (VTI): 0.4 cm^2/m^2. Peak velocity ratio of LVOT to aortic valve: 0.16. Indexed valve area (Vmax): 0.3 cm^2/m^2. Mean velocity ratio of LVOT to aortic valve: 0.14. Indexed valve area (Vmean): 0.27 cm^2/m^2.    Mean gradient (S): 41 mm Hg. Peak gradient (S): 62 mm Hg.  ------------------------------------------------------------------- Aorta:  Aortic root: The aortic root was normal in size. Ascending aorta: The ascending aorta was normal in size.  ------------------------------------------------------------------- Mitral valve:   Normal thickness leaflets .  Doppler:   There was no evidence for stenosis.   There was mild regurgitation.    Peak gradient (D): 3 mm Hg.  ------------------------------------------------------------------- Left atrium:  The atrium was mildly dilated.  ------------------------------------------------------------------- Right ventricle:  The cavity size was normal. Systolic function was moderately reduced.  ------------------------------------------------------------------- Pulmonic valve:    Structurally normal  valve.   Cusp separation was normal.  Doppler:  Transvalvular velocity was within the normal range. There was no regurgitation.  ------------------------------------------------------------------- Tricuspid valve:   Doppler:  There was mild regurgitation.  ------------------------------------------------------------------- Right atrium:  The atrium was mildly dilated.  ------------------------------------------------------------------- Pericardium:  There was no pericardial effusion.  ------------------------------------------------------------------- Systemic veins:  IVC measured 2.3 cm with < 50% respirophasic variation, suggesting RA pressure 15 mmHg.  ------------------------------------------------------------------- Measurements   Left ventricle                           Value          Reference  Stroke volume, 2D                        50    ml       ----------  Stroke volume/bsa, 2D                    23    ml/m^2   ----------  LV ejection fraction, 1-p A4C            36    %        ----------  LV end-diastolic volume, 2-p             162   ml       ----------  LV end-systolic volume, 2-p              102   ml       ----------  LV ejection fraction, 2-p                37    %        ----------  Stroke volume, 2-p                       60    ml       ----------  LV end-diastolic volume/bsa, 2-p         76    ml/m^2   ----------  LV end-systolic volume/bsa, 2-p          48    ml/m^2   ----------  Stroke volume/bsa, 2-p                   28.1  ml/m^2   ----------  LV e&', lateral                           8.11  cm/s     ----------  LV E/e&', lateral                         11.31          ----------  LV e&', medial                            2.92  cm/s     ----------  LV E/e&', medial                          31.4           ----------  LV e&', average                           5.52  cm/s     ----------  LV E/e&', average                         16.63           ----------    LVOT                                     Value          Reference  LVOT ID, S                               23    mm       ----------  LVOT area                                4.15  cm^2     ----------  LVOT peak velocity, S                    60.9  cm/s     ----------  LVOT mean velocity, S                    41.7  cm/s     ----------  LVOT VTI, S                              12    cm       ----------    Aortic valve                             Value          Reference  Aortic valve peak velocity, S            392   cm/s     ----------  Aortic valve mean velocity, S            303   cm/s     ----------  Aortic valve VTI, S                      58.6  cm       ----------  Aortic mean gradient, S                  40    mm Hg    ----------  Aortic peak gradient, S                  61    mm Hg    ----------  VTI ratio, LVOT/AV                       0.2            ----------  Aortic valve area, VTI                   0.85  cm^2     ----------  Aortic valve area/bsa, VTI               0.4   cm^2/m^2 ----------  Velocity ratio, peak, LVOT/AV            0.16           ----------  Aortic valve area/bsa, peak              0.3   cm^2/m^2 ----------  velocity  Velocity ratio, mean, LVOT/AV            0.14           ----------  Aortic valve area/bsa, mean              0.27  cm^2/m^2 ----------  velocity    Left atrium                              Value          Reference  LA volume, S                             45.2  ml       ----------  LA volume/bsa, S                         21.2  ml/m^2   ----------  LA volume, ES, 1-p A4C  51.3  ml       ----------  LA volume/bsa, ES, 1-p A4C               24    ml/m^2   ----------  LA volume, ES, 1-p A2C                   40    ml       ----------  LA volume/bsa, ES, 1-p A2C               18.7  ml/m^2   ----------    Mitral valve                             Value          Reference  Mitral E-wave peak velocity               91.7  cm/s     ----------  Mitral deceleration time          (L)    134   ms       150 - 230  Mitral peak gradient, D                  3     mm Hg    ----------  Mitral regurg VTI, PISA                  141   cm       ----------    Pulmonary arteries                       Value          Reference  PA pressure, S, DP                (H)    49    mm Hg    <=30    Tricuspid valve                          Value          Reference  Tricuspid regurg peak velocity           293   cm/s     ----------  Tricuspid peak RV-RA gradient            34    mm Hg    ----------    Right atrium                             Value          Reference  RA ID, S-I, ES, A4C               (H)    57.8  mm       34 - 49  RA area, ES, A4C                  (H)    21.3  cm^2     8.3 - 19.5  RA volume, ES, A/L                       65.2  ml       ----------  RA volume/bsa, ES, A/L  30.5  ml/m^2   ----------    Right ventricle                          Value          Reference  RV ID, minor axis, ED, A4C base          35    mm       ----------  TAPSE                                    10.1  mm       ----------  RV s&', lateral, S                        6.24  cm/s     ----------  Legend: (L)  and  (H)  mark values outside specified reference range.  ------------------------------------------------------------------- Prepared and Electronically Authenticated by  Marca Ancona, M.D. 2018-06-07T11:55:37   Right/Left Heart Cath and Coronary Angiography  Conclusion   1. Severe single-vessel coronary artery disease involving the RCA in this patient with a remote inferior wall infarct 2. Mild nonobstructive LAD and left circumflex stenosis 3. 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 Aortic valve hemodynamics with a mean gradient of 37 mmHg and calculated valve area of 0.68 cm 4. Severe left ventricular systolic dysfunction with LVEF less than 30% and hemodynamics demonstrating elevated  intracardiac filling pressures and preserved cardiac output  Plan: 2-D echocardiogram, cardiac surgical referral for consideration of treatment options of aortic stenosis in the context of CAD, severe cardiomyopathy, and heart failure  Indications   Severe aortic stenosis [I35.0 (ICD-10-CM)]  Procedural Details/Technique   Technical Details INDICATION: CHF, aortic stenosis. 61 year old gentleman with history of remote inferior wall MI treated emergently with PCI to right coronary artery in 2003. He was lost to follow-up but recently was hospitalized at [facility] with heart failure. An echocardiogram reportedly showed severe LV dysfunction and severe aortic stenosis. He was evaluated as an outpatient by Dr. Purvis Sheffield and referred for right and left heart catheterization.  PROCEDURAL DETAILS: There was an indwelling IV in a right antecubital vein. Using normal sterile technique, the IV was changed out for a 5 Fr brachial sheath over a 0.018 inch wire. The right wrist was then prepped, draped, and anesthetized with 1% lidocaine. Using the modified Seldinger technique a 5/6 French Slender sheath was placed in the right radial artery. Intra-arterial verapamil was administered through the radial artery sheath. IV heparin was administered after a JR4 catheter was advanced into the central aorta. A Swan-Ganz catheter was used for the right heart catheterization. Standard protocol was followed for recording of right heart pressures and sampling of oxygen saturations. Fick cardiac output was calculated. Standard Judkins catheters were used for selective coronary angiography and left ventriculography. The aortic valve was crossed with an AL-1 catheter directing a J-tip wire across the valve. A pullback gradient was measured across the aortic valve. There were no immediate procedural complications. The patient was transferred to the post catheterization recovery area for further monitoring.     Estimated  blood loss <50 mL.  During this procedure the patient was administered the following to achieve and maintain moderate conscious sedation: Versed 1 mg, Fentanyl 25 mcg, while the patient's heart rate, blood pressure, and oxygen saturation were continuously monitored. The period of conscious sedation was  40 minutes, of which I was present face-to-face 100% of this time.    Coronary Findings   Dominance: Right  Left Anterior Descending  Prox LAD to Mid LAD lesion, 40% stenosed.  Left Circumflex  Prox Cx to Mid Cx lesion, 50% stenosed. The lesion is mildly calcified. There is mild to moderate diffuse left circumflex stenosis throughout the mid vessel with no more than 50% luminal narrowing  Right Coronary Artery  Ost RCA to Prox RCA lesion, 75% stenosed. The lesion was previously treated. There are overlapping stents in the proximal right coronary artery hanging out from the ostium of the vessel into the aorta. The stented segment has 75% stenosis.  Mid RCA lesion, 80% stenosed. The lesion is moderately calcified. The entire RCA is diseased. There is diffuse calcification and severe stenosis in the mid vessel.  Right Posterior Descending Artery  Ost RPDA lesion, 90% stenosed. There is tight ostial stenosis in the PDA  Wall Motion              Left Heart   Left Ventricle The left ventricle is dilated. There is severe left ventricular systolic dysfunction. LV end diastolic pressure is severely elevated. The left ventricular ejection fraction is 25-35% by visual estimate. There are LV function abnormalities due to segmental dysfunction. There is severe LV dysfunction. The entire inferior wall from base to apex is akinetic. The anterolateral wall is hypokinetic. The LVEF is estimated at less than 30%.    Aortic Valve There is severe aortic valve stenosis. There is trivial (1+) aortic regurgitation. The aortic valve is calcified. There is restricted aortic valve motion. The aortic valve is  calcified with restricted leaflet mobility. The valve has a bicuspid appearance. There is trivial aortic insufficiency. The mean transvalvular gradient is 37 mmHg. The peak to peak gradient is 48 mmHg and the calculated aortic valve area is 0.68 cm    Coronary Diagrams   Diagnostic Diagram       Implants     No implant documentation for this case.  PACS Images   Show images for Cardiac catheterization   Link to Procedure Log   Procedure Log    Hemo Data    Most Recent Value  Fick Cardiac Output 4.67 L/min  Fick Cardiac Output Index 2.23 (L/min)/BSA  Aortic Mean Gradient 36.7 mmHg  Aortic Peak Gradient 48 mmHg  Aortic Valve Area 0.68  Aortic Value Area Index 0.33 cm2/BSA  RA A Wave 18 mmHg  RA V Wave 15 mmHg  RA Mean 12 mmHg  RV Systolic Pressure 54 mmHg  RV Diastolic Pressure 4 mmHg  RV EDP 18 mmHg  PA Systolic Pressure 55 mmHg  PA Diastolic Pressure 26 mmHg  PA Mean 38 mmHg  PW A Wave 26 mmHg  PW V Wave 33 mmHg  PW Mean 25 mmHg  AO Systolic Pressure 98 mmHg  AO Diastolic Pressure 67 mmHg  AO Mean 80 mmHg  LV Systolic Pressure 150 mmHg  LV Diastolic Pressure 12 mmHg  LV EDP 29 mmHg  Arterial Occlusion Pressure Extended Systolic Pressure 96 mmHg  Arterial Occlusion Pressure Extended Diastolic Pressure 60 mmHg  Arterial Occlusion Pressure Extended Mean Pressure 75 mmHg  Left Ventricular Apex Extended Systolic Pressure 144 mmHg  Left Ventricular Apex Extended Diastolic Pressure 12 mmHg  Left Ventricular Apex Extended EDP Pressure 27 mmHg  QP/QS 1  TPVR Index 17.01 HRUI  TSVR Index 35.83 HRUI  PVR SVR Ratio 0.19  TPVR/TSVR Ratio 0.47    Cardiac TAVR  CT  TECHNIQUE: The patient was scanned on a Philips 256 scanner. A 120 kV retrospective scan was triggered in the descending thoracic aorta at 111 HU's. Gantry rotation speed was 270 msecs and collimation was .9 mm. 10 mg of iv Metoprolol and no nitro were given. The 3D data set was reconstructed in 5%  intervals of the R-R cycle. Systolic and diastolic phases were analyzed on a dedicated work station using MPR, MIP and VRT modes. The patient received 80 cc of contrast.  FINDINGS: Aortic Valve: Left and right leaflet are partially co-joined, however the valve opens in a pattern of a tricuspid valve. There is severe thickening and calcification with moderate restriction in leaflet opening. There are no calcifications extending into the LVOT.  Aorta:  Normal size, no dissection, mild diffuse calcifications.  Sinotubular Junction:  28 x 26 mm  Ascending Thoracic Aorta:  33 x 33 mm  Aortic Arch:  29 x 27 mm  Descending Thoracic Aorta:  24 x 22 mm  Sinus of Valsalva Measurements:  Non-coronary:  31 mm  Right -coronary:  31 mm  Left -coronary:  30 mm  Coronary Artery Height above Annulus:  Left Main:  9 mm  Right Coronary:  15 mm  Virtual Basal Annulus Measurements:  Maximum/Minimum Diameter:  26 x 23 mm  Perimeter:  79 mm  Area:  477 mm  Optimum Fluoroscopic Angle for Delivery:  RAO 2 CRA 1  IMPRESSION: 1. The aortic valve is severely thickened and calcified with partially co-joined left and right leaflet opening in a pattern of a tricuspid valve. Annular measurements suitable for delivery of a 26 mm Edwards-SAPIEN 3 valve.  2. Borderline annulus to left main height for a 26 mm valve, however there are wide sinuses. Sufficient annulus to RCA distance.  3. Optimum Fluoroscopic Angle for Delivery:  RAO 2 CRA 1  4.  No thrombus in the left atrial appendage.  Eugene Frazier   Electronically Signed   By: Eugene Frazier   On: 05/18/2017 11:43      STS Risk Calculator  Procedure    AVR + CABG  Risk of Mortality   1.9% Morbidity or Mortality  17.4% Prolonged LOS   6.5% Short LOS    37.0% Permanent Stroke   1.2% Prolonged Vent Support  9.7% DSW Infection    0.4% Renal  Failure    3.8% Reoperation    7.5%     Impression:  Patient has stage D severe symptomatic aortic stenosis with severe single-vessel coronary artery disease and chronic systolic congestive heart failure. He presented with a gradual progression of symptoms of congestive heart failure over many months until 4-6 weeks ago when he eventually had advanced disease with severe volume overload and class IIIB symptoms of exertional shortness of breath, fatigue, abdominal swelling, and lower extremity edema. He has improved dramatically on medical therapy but he continues to experience exertional shortness of breath consistent with class II symptoms. I have personally reviewed the patient's recent transthoracic echocardiogram and diagnostic cardiac catheterization. The patient has severe global left ventricular systolic dysfunction with ejection fraction estimated less than 30%. 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 There is severe calcification, thickening, and restricted leaflet mobility involving all 3 leaflets.  Despite the presence of severe left ventricular dysfunction, peak velocity across the aortic valve was measured greater than 3.9 m/s corresponding to mean transvalvular gradient estimated 41 mmHg. Diagnostic catheterization  confirmed the presence of severe aortic stenosis and revealed severe single-vessel coronary artery disease involving the right coronary artery.  Patient also had elevated filling pressures but preserved cardiac output. I agree the patient needs aortic valve replacement. He would probably benefit from concomitant surgical revascularization of the right coronary artery. Risks associated with conventional surgery will be somewhat elevated because of the degree of left ventricular systolic dysfunction, but still relatively low.  Cardiac gated CT angiogram of the heart and aorta demonstrate anatomical  characteristics consistent with severe aortic stenosis without any significant complicating features. However, the origin of the left main coronary artery is relatively low and close to the aortic annulus.  He also has poor dentition with at least 2 loose teeth and will need dental extraction prior to surgery.    Plan:  The patient and his close friend were counseled at length regarding treatment alternatives for management of severe aortic stenosis including continued medical therapy versus proceeding with aortic valve replacement with or without coronary artery bypass grafting in the near future.  The natural history of aortic stenosis was reviewed, as was long term prognosis with medical therapy alone.  Surgical options were discussed at length including conventional surgical aortic valve replacement through either a full median sternotomy or using minimally invasive techniques.  Other alternatives including rapid-deployment bioprosthetic tissue valve replacement and transcatheter aortic valve replacement were discussed.  Discussion was held comparing the relative risks of mechanical valve replacement with need for lifelong anticoagulation versus use of a bioprosthetic tissue valve and the associated potential for late structural valve deterioration and failure.  This discussion was placed in the context of the patient's particular circumstances, and as a result the patient specifically requests that their valve be replaced using a bioprosthetic tissue valve .  The potential advantages and disadvantages associated with use of a rapid-deployment bioprosthetic aortic valve were discussed, including the risks of paravalvular leak, need for permanent pacemaker placement, and expectations for long-term durability.  The patient hopes to proceed with surgery as soon as possible. He will be referred for dental service consultation and likely dental extraction.  We tentatively plan to proceed with aortic valve  replacement and coronary artery bypass grafting on 06/13/2017, presuming that his dental extraction can be accomplished during the interim period of time. The patient will return to our office for follow-up on 06/11/2017. All of his questions have been addressed.   I spent in excess of 90 minutes during the conduct of this office consultation and >50% of this time involved direct face-to-face encounter with the patient for counseling and/or coordination of their care.    Salvatore Decent. Cornelius Moras, MD 05/21/2017 5:51 PM

## 2017-05-21 NOTE — Patient Instructions (Signed)
Continue all previous medications without any changes at this time  

## 2017-05-23 ENCOUNTER — Other Ambulatory Visit: Payer: Self-pay

## 2017-05-23 DIAGNOSIS — I350 Nonrheumatic aortic (valve) stenosis: Secondary | ICD-10-CM

## 2017-05-23 DIAGNOSIS — I2510 Atherosclerotic heart disease of native coronary artery without angina pectoris: Secondary | ICD-10-CM

## 2017-05-29 ENCOUNTER — Encounter (HOSPITAL_COMMUNITY): Payer: Self-pay | Admitting: Dentistry

## 2017-05-29 ENCOUNTER — Ambulatory Visit (HOSPITAL_COMMUNITY): Payer: Self-pay | Admitting: Dentistry

## 2017-05-29 VITALS — BP 118/77 | HR 82 | Temp 98.6°F

## 2017-05-29 DIAGNOSIS — K083 Retained dental root: Secondary | ICD-10-CM | POA: Diagnosis not present

## 2017-05-29 DIAGNOSIS — Z01818 Encounter for other preprocedural examination: Secondary | ICD-10-CM

## 2017-05-29 DIAGNOSIS — K045 Chronic apical periodontitis: Secondary | ICD-10-CM

## 2017-05-29 DIAGNOSIS — M264 Malocclusion, unspecified: Secondary | ICD-10-CM

## 2017-05-29 DIAGNOSIS — K06010 Localized gingival recession, unspecified: Secondary | ICD-10-CM

## 2017-05-29 DIAGNOSIS — K08409 Partial loss of teeth, unspecified cause, unspecified class: Secondary | ICD-10-CM

## 2017-05-29 DIAGNOSIS — K029 Dental caries, unspecified: Secondary | ICD-10-CM

## 2017-05-29 DIAGNOSIS — K0530 Chronic periodontitis, unspecified: Secondary | ICD-10-CM | POA: Diagnosis not present

## 2017-05-29 DIAGNOSIS — K036 Deposits [accretions] on teeth: Secondary | ICD-10-CM

## 2017-05-29 DIAGNOSIS — K011 Impacted teeth: Secondary | ICD-10-CM

## 2017-05-29 DIAGNOSIS — I350 Nonrheumatic aortic (valve) stenosis: Secondary | ICD-10-CM

## 2017-05-29 DIAGNOSIS — K0889 Other specified disorders of teeth and supporting structures: Secondary | ICD-10-CM

## 2017-05-29 MED ORDER — CLINDAMYCIN PHOSPHATE 600 MG/50ML IV SOLN: 600.0000 mg | Freq: Once | INTRAVENOUS | Status: DC

## 2017-05-29 NOTE — Progress Notes (Signed)
DENTAL CONSULTATION  Date of Consultation:  05/29/2017 Patient Name:   Eugene Frazier Date of Birth:   Dec 21, 1955 Medical Record Number: [id number]  VITALS: BP 118/77 (BP Location: Left Arm)   Pulse 82   Temp 98.6 F (37 C) (Oral)   CHIEF COMPLAINT: Patient referred by Dr. Cornelius Moras for dental consultation.  HPI: Eugene Frazier is a 61 year old male referred by Dr. Cornelius Moras for dental consultation. Patient recently diagnosed with severe aortic stenosis. Patient with anticipated aortic valve replacement. Patient now seen as part of a medically necessary pre-heart valve surgery dental protocol examination to rule out dental infection that may affect the patient's systemic health and anticipated heart valve surgery.  The patient currently denies acute toothaches, swellings, or abscesses. Patient has not seen a dentist since 2009. Patient sees Dr. Jettie Booze in [place], [place]. Patient had a checkup and dental cleaning at that time. Patient has a maxillary cast  partial denture that "fits fine by patient report. This was fabricated approximately 10 years ago. Patient denies having dental phobia.  PROBLEM LIST: Patient Active Problem List   Diagnosis Date Noted  . Severe aortic stenosis 05/09/2017    Priority: High  . Coronary artery disease involving native coronary artery of native heart without angina pectoris   . Chronic systolic (congestive) heart failure (HCC)     PMH: Past Medical History:  Diagnosis Date  . Aortic stenosis   . Chronic systolic (congestive) heart failure (HCC)   . Coronary artery disease involving native coronary artery of native heart without angina pectoris   . Myocardial infarction [facility]) 2003    PSH: Past Surgical History:  Procedure Laterality Date  . CORONARY ANGIOPLASTY  2003  . RIGHT/LEFT HEART CATH AND CORONARY ANGIOGRAPHY N/A 05/09/2017   Procedure: Right/Left Heart Cath and Coronary Angiography;  Surgeon: Tonny Bollman, MD;  Location: [facility] INVASIVE CV LAB;   Service: Cardiovascular;  Laterality: N/A;    ALLERGIES: Allergies  Allergen Reactions  . Penicillins Hives    Fever over 104  Has patient had a PCN reaction causing immediate rash, facial/tongue/throat swelling, SOB or lightheadedness with hypotension: No Has patient had a PCN reaction causing severe rash involving mucus membranes or skin necrosis: Yes Has patient had a PCN reaction that required hospitalization: No Has patient had a PCN reaction occurring within the last 10 years: No If all of the above answers are "NO", then may proceed with Cephalosporin use.     MEDICATIONS: Current Outpatient Prescriptions  Medication Sig Dispense Refill  . aspirin EC 81 MG tablet Take 1 tablet (81 mg total) by mouth daily.    . diphenhydrAMINE (BENADRYL) 25 MG tablet Take 25 mg by mouth every 6 (six) hours as needed for itching.    . furosemide (LASIX) 40 MG tablet Take 40 mg by mouth daily.     Marland Kitchen Ketotifen Fumarate (ALAWAY OP) Apply 1 drop to eye daily as needed (allergies).    . metoprolol succinate (TOPROL-XL) 25 MG 24 hr tablet Take 25mg s by mouth once daily     No current facility-administered medications for this visit.     LABS: Lab Results  Component Value Date   WBC 7.6 05/03/2017   HGB 15.1 05/03/2017   HCT 45.5 05/03/2017   MCV 88.0 05/03/2017   PLT 229 05/03/2017      Component Value Date/Time   NA 138 05/03/2017 1220   K 3.8 05/03/2017 1220   CL 102 05/03/2017 1220   CO2 27 05/03/2017 1220   GLUCOSE  96 05/03/2017 1220   BUN 16 05/03/2017 1220   CREATININE 1.26 (H) 05/03/2017 1220   CALCIUM 9.5 05/03/2017 1220   GFRNONAA >60 05/03/2017 1220   GFRAA >60 05/03/2017 1220   Lab Results  Component Value Date   INR 1.15 05/03/2017   No results found for: PTT  SOCIAL HISTORY: Social History   Social History  . Marital status: Divorced    Spouse name: N/A  . Number of children: 1  . Years of education: N/A   Occupational History  . Not on file.   Social  History Main Topics  . Smoking status: Former Smoker    Packs/day: 2.00    Years: 20.00    Quit date: 05/02/1997  . Smokeless tobacco: Never Used  . Alcohol use Not on file  . Drug use: No  . Sexual activity: Not on file   Other Topics Concern  . Not on file   Social History Narrative   Patient is divorced 2. Patient had one son with his first wife.   Patient with a history of smoking 2 packs per day for 20 years. Patient quit in 1998.   Patient never used smokeless tobacco.   Patient denies use of drugs or alcohol.     FAMILY HISTORY: Family History  Problem Relation Age of Onset  . Alzheimer's disease Mother   . Lung disease Father     REVIEW OF SYSTEMS: Reviewed with the patient as per History of present illness. Psych: Patient denies having dental phobia.  DENTAL HISTORY: CHIEF COMPLAINT: Patient referred by Dr. Cornelius Moraswen for dental consultation.  HPI: Eugene Frazier is a 61 year old male referred by Dr. Cornelius Moraswen for dental consultation. Patient recently diagnosed with severe aortic stenosis. Patient with anticipated aortic valve replacement. Patient now seen as part of a medically necessary pre-heart valve surgery dental protocol examination to rule out dental infection that may affect the patient's systemic health and anticipated heart valve surgery.  The patient currently denies acute toothaches, swellings, or abscesses. Patient has not seen a dentist since 2009. Patient sees Dr. Jettie Boozeietrich in [place], [place]. Patient had a checkup and dental cleaning at that time. Patient has a maxillary cast  partial denture that "fits fine by patient report. This was fabricated approximately 10 years ago. Patient denies having dental phobia.   DENTAL EXAMINATION: GENERAL:The patient is a well-developed, well-nourished male in no acute distress. HEAD AND NECK: There is no palpable neck lymphadenopathy. The patient  has left TMJ click/pop on maximum opening but denies acute TMJ  symptoms. INTRAORAL EXAM: The patient has normal saliva. There is no evidence of oral abscess formation. DENTITION: The patient is missing tooth numbers 4, 8, 9, 15, 18, 19, 25, 28, and 29. Tooth numbers 1, 17, and 32 are impacted. There are retained root segments in the area of tooth numbers 2, 3, and 30. PERIODONTAL: The patient has chronic, advanced periodontal disease with plaque and calculus accumulations, generalized gingival recession, and tooth mobility. There is moderate to severe bone loss noted. Radiographic calculus is noted. DENTAL CARIES/SUBOPTIMAL RESTORATIONS: Multiple dental caries are noted as per dental charting form. ENDODONTIC: Patient currently denies acute pulpitis symptoms. There are multiple areas of periapical pathology and radiolucency. CROWN AND BRIDGE: There are no crown or bridge restorations. PROSTHODONTIC: Patient has a maxillary cast partial denture  that is less than ideal but clinically acceptable. This was fabricated approximately 10 years ago.  OCCLUSION: Patient has a poor occlusal scheme secondary to multiple missing teeth, supra-eruption and  drifting of the unopposed teeth into the edentulous areas, and lack of replacement of all missing teeth with dental prostheses.  RADIOGRAPHIC INTERPRETATION: Orthopantogram was taken and supplemented with a full series of dental radiographs. There are multiple missing teeth. There are multiple retained root segments. There are impacted tooth numbers 1, 17, and 32. Multiple dental caries are noted. Multiple areas of periapical pathology and radiolucency are noted. There is moderate to severe bone loss noted.  ASSESSMENTS: 1. Severe aortic stenosis 2. Pre-heart valve surgery dental protocol 3. Chronic apical periodontitis 4. Dental caries 5. Multiple retained root segments 6. Chronic periodontitis with bone loss 7. Gingival recession 8. Accretions 9. Tooth mobility 10. Multiple missing teeth 11. Supra-eruption and  drifting of the unopposed teeth into the edentulous areas 12. Impacted wisdom teeth numbers 1, 17, and 32.  13. Poor occlusal scheme and malocclusion 14. Risk for complications up to and including death with anticipated invasive dental procedures in the operating room with general anesthesia secondary to cardiovascular compromise 15. Need for evaluation for antibiotic premedication prior to invasive dental procedures due to severe aortic stenosis and anticipated heart valve surgery   PLAN/RECOMMENDATIONS: 1. I discussed the risks, benefits, and complications of various treatment options with the patient in relationship to his medical and dental conditions, anticipated heart valve surgery, and risk for endocarditis. We discussed various treatment options to include no treatment,  total and subtotal extractions with alveoloplasty, pre-prosthetic surgery as indicated, periodontal therapy, dental restorations, root canal therapy, crown and bridge therapy, implant therapy, and replacement of missing teeth as indicated.  We also discussed referral to an oral surgeon for dental extraction procedures including the impacted wisdom teeth. The patient currently refuses referral to an oral surgeon.  The patient currently wishes to proceed with extraction of tooth numbers 2, 3, 13, 14, 23, 24, 26, 30, and 31 with alveoloplasty and gross debridement of remaining dentition in the operating room with general anesthesia. This has been scheduled at [facility] for Thursday, 05/31/17 at 7:30 AM. The patient will then proceed with heart valve surgery with Dr. Cornelius Moras. After adequate healing and once medically cleared by Dr. Cornelius Moras, the patient will follow-up with his primary dentist for dental restorations, continued periodontal therapy and evaluation for replacement of missing teeth as indicated. The patient will require antibiotic premedication prior to invasive dental procedures after the heart valve surgery as per  American Heart Association guidelines.   2. Discussion of findings with medical team and coordination of future medical and dental care as needed.  I spent in excess of  120 minutes during the conduct of this consultation and >50% of this time involved direct face-to-face encounter for counseling and/or coordination of the patient's care.    Charlynne Pander, DDS

## 2017-05-29 NOTE — Patient Instructions (Signed)
[place]    Department of Dental Medicine     DR. KULINSKI      HEART VALVES AND MOUTH CARE:  FACTS:   If you have any infection in your mouth, it can infect your heart valve.  If you heart valve is infected, you will be seriously ill.  Infections in the mouth can be SILENT and do not always cause pain.  Examples of infections in the mouth are gum disease, dental cavities, and abscesses.  Some possible signs of infection are: Bad breath, bleeding gums, or teeth that are sensitive to sweets, hot, and/or cold. There are many other signs as well.  WHAT YOU HAVE TO DO:   Brush your teeth after meals and at bedtime. Spend at least 2 minutes brushing well, especially behind your back teeth and all around your teeth that stand alone. Brush at the gumline also.  Do not go to bed without brushing your teeth and flossing.  If you gums bleed when you brush or floss, do NOT stop brushing or flossing. It usually means that your gums need more attention and better cleaning.   If your Dentist or Dr. Kulinski gave you a prescription mouthwash to use, make sure to use it as directed. If you run out of the medication, get a refill at the pharmacy.   If you were given any other medications or directions by your Dentist, please follow them. If you did not understand the directions or forget what you were told, please call. We will be happy to refresh her memory.  If you need antibiotics before dental procedures, make sure you take them one hour prior to every dental visit as directed.   Get a dental checkup every 4-6 months in order to keep your mouth healthy, or to find and treat any new infection. You will most likely need your teeth cleaned or gums treated at the same time.  If you are not able to come in for your scheduled appointment, call your Dentist as soon as possible to reschedule.  If you have a problem in between dental visits, call your Dentist.  

## 2017-05-29 NOTE — Addendum Note (Signed)
Addended by: Cindra EvesKULINSKI, Brenee Gajda F on: 05/29/2017 10:12 AM   Modules accepted: Orders, SmartSet

## 2017-05-30 ENCOUNTER — Encounter (HOSPITAL_COMMUNITY): Payer: Self-pay | Admitting: *Deleted

## 2017-05-30 NOTE — Progress Notes (Signed)
Anesthesia Chart Review: SAME DAY WORK-UP.  Patient is a 61 year old male scheduled for multiple extraction with alveoloplasty and gross debridement of remaining teeth on 05/31/17 by Dr. Kristin BruinsKulinski. He is tentatively scheduled for AVR and CABG on 06/13/17 by Dr. Tressie Stalkerlarence Owen.  History includes former smoker (quit '98), severe AS, CAD/inferior MI s/p RCA PCI '03, chronic systolic CHF, HTN, dyspnea.   PCP is Dr. Donzetta Sprungerry Daniel. Cardiologist is Dr. Prentice DockerSuresh Koneswaran.  Meds include aspirin 81 mg, Lasix, Toprol-XL, clindamycin, ketotifen ophthalmic.   EKG 05/02/17: SR, non-specific T wave abnormality.  CT coronary 05/17/17: IMPRESSION: 1. The aortic valve is severely thickened and calcified with partially co-joined left and right leaflet opening in a pattern of a tricuspid valve. Annular measurements suitable for delivery of a 26 mm Edwards-SAPIEN 3 valve. 2. Borderline annulus to left main height for a 26 mm valve, however there are wide sinuses. Sufficient annulus to RCA distance. 3. Optimum Fluoroscopic Angle for Delivery:  RAO 2 CRA 1 4.  No thrombus in the left atrial appendage.  Cardiac cath 05/09/17: 1. Severe single-vessel coronary artery disease involving the RCA in this patient with a remote inferior wall infarct (75% ostial-proximal RCA, previously treated; 80% mid RCA; 90% ostial RPDA) 2. Mild nonobstructive LAD (40%) and left circumflex (50%) stenosis 3. 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 Aortic valve hemodynamics with a mean gradient of 37 mmHg and calculated valve area of 0.68 cm 4. Severe left ventricular systolic dysfunction with LVEF less than 30% and hemodynamics demonstrating elevated intracardiac filling pressures and preserved cardiac output Plan: 2-D echocardiogram, cardiac surgical referral for consideration of treatment options of aortic stenosis in the context of CAD, severe cardiomyopathy, and heart failure  Echo 05/09/17: Study Conclusions - Left  ventricle: The cavity size was normal. Wall thickness was   normal. Systolic function was severely reduced. The estimated   ejection fraction was in the range of 25% to 30%. Diffuse   hypokinesis. Doppler parameters are consistent with restrictive   physiology, indicative of decreased left ventricular diastolic   compliance and/or increased left atrial pressure. E/medial e&' >   15, suggesting LVEDP at least 20 mmHg. - Aortic valve: Trileaflet; severely calcified leaflets. There was   severe stenosis. Mean gradient (S): 41 mm Hg. Peak gradient (S):   62 mm Hg. Valve area (VTI): 0.85 cm^2. - Mitral valve: There was mild regurgitation. - Left atrium: The atrium was mildly dilated. - Right ventricle: The cavity size was normal. Systolic function   was moderately reduced. - Right atrium: The atrium was mildly dilated. - Pulmonary arteries: PA peak pressure: 49 mm Hg (S). - Systemic veins: IVC measured 2.3 cm with < 50% respirophasic   variation, suggesting RA pressure 15 mmHg.  CXR 05/03/17: IMPRESSION: Single nodular opacities noted projected over both lung bases, most likely nipple shadows. Repeat PA and lateral chest x-ray with nipple markers suggested.  He will need updated labs prior to surgery. Cr on 05/03/17 was 1.26.   In orders, Dr. Kristin BruinsKulinski ordered anesthesia preoperative consult "to assess ability to proceed with general anesthesiha with nasoendotracheal tube." Patient is a same day work-up, so anesthesiologist to evaluate on the day of surgery.   Velna Ochsllison Xaiden Fleig, PA-C [facility]iology Phone [phone] 05/30/2017 1:18 PM

## 2017-05-30 NOTE — Progress Notes (Signed)
Instructions given to pt, voices understanding, with teach back complete. Revonda Standardllison called and informed of order for anaesthesia consult.

## 2017-05-31 ENCOUNTER — Ambulatory Visit (HOSPITAL_COMMUNITY)
Admission: RE | Admit: 2017-05-31 | Discharge: 2017-05-31 | Disposition: A | Discharge status: Home / Self Care | Payer: BLUE CROSS/BLUE SHIELD | Source: Ambulatory Visit | Attending: Dentistry | Admitting: Dentistry

## 2017-05-31 ENCOUNTER — Encounter (HOSPITAL_COMMUNITY)
Admission: RE | Disposition: A | Discharge status: Home / Self Care | Payer: Self-pay | Source: Ambulatory Visit | Attending: Dentistry

## 2017-05-31 ENCOUNTER — Encounter (HOSPITAL_COMMUNITY): Payer: Self-pay | Admitting: Certified Registered Nurse Anesthetist

## 2017-05-31 ENCOUNTER — Ambulatory Visit (HOSPITAL_COMMUNITY): Payer: BLUE CROSS/BLUE SHIELD | Admitting: Vascular Surgery

## 2017-05-31 DIAGNOSIS — M1990 Unspecified osteoarthritis, unspecified site: Secondary | ICD-10-CM | POA: Insufficient documentation

## 2017-05-31 DIAGNOSIS — I5022 Chronic systolic (congestive) heart failure: Secondary | ICD-10-CM | POA: Insufficient documentation

## 2017-05-31 DIAGNOSIS — Z9861 Coronary angioplasty status: Secondary | ICD-10-CM | POA: Insufficient documentation

## 2017-05-31 DIAGNOSIS — Z820 Family history of epilepsy and other diseases of the nervous system: Secondary | ICD-10-CM | POA: Diagnosis not present

## 2017-05-31 DIAGNOSIS — K0530 Chronic periodontitis, unspecified: Secondary | ICD-10-CM

## 2017-05-31 DIAGNOSIS — K036 Deposits [accretions] on teeth: Secondary | ICD-10-CM | POA: Diagnosis not present

## 2017-05-31 DIAGNOSIS — Z951 Presence of aortocoronary bypass graft: Secondary | ICD-10-CM | POA: Insufficient documentation

## 2017-05-31 DIAGNOSIS — I2510 Atherosclerotic heart disease of native coronary artery without angina pectoris: Secondary | ICD-10-CM | POA: Diagnosis not present

## 2017-05-31 DIAGNOSIS — K0889 Other specified disorders of teeth and supporting structures: Secondary | ICD-10-CM | POA: Insufficient documentation

## 2017-05-31 DIAGNOSIS — I272 Pulmonary hypertension, unspecified: Secondary | ICD-10-CM | POA: Insufficient documentation

## 2017-05-31 DIAGNOSIS — Z836 Family history of other diseases of the respiratory system: Secondary | ICD-10-CM | POA: Diagnosis not present

## 2017-05-31 DIAGNOSIS — K029 Dental caries, unspecified: Secondary | ICD-10-CM

## 2017-05-31 DIAGNOSIS — I252 Old myocardial infarction: Secondary | ICD-10-CM | POA: Insufficient documentation

## 2017-05-31 DIAGNOSIS — I110 Hypertensive heart disease with heart failure: Secondary | ICD-10-CM | POA: Diagnosis not present

## 2017-05-31 DIAGNOSIS — Z87891 Personal history of nicotine dependence: Secondary | ICD-10-CM | POA: Insufficient documentation

## 2017-05-31 DIAGNOSIS — I350 Nonrheumatic aortic (valve) stenosis: Secondary | ICD-10-CM | POA: Diagnosis not present

## 2017-05-31 DIAGNOSIS — K083 Retained dental root: Secondary | ICD-10-CM | POA: Diagnosis not present

## 2017-05-31 DIAGNOSIS — K045 Chronic apical periodontitis: Secondary | ICD-10-CM | POA: Insufficient documentation

## 2017-05-31 HISTORY — PX: MULTIPLE EXTRACTIONS WITH ALVEOLOPLASTY: SHX5342

## 2017-05-31 HISTORY — DX: Dyspnea, unspecified: R06.00

## 2017-05-31 HISTORY — DX: Cardiac murmur, unspecified: R01.1

## 2017-05-31 HISTORY — DX: Personal history of urinary calculi: Z87.442

## 2017-05-31 HISTORY — DX: Essential (primary) hypertension: I10

## 2017-05-31 HISTORY — DX: Unspecified osteoarthritis, unspecified site: M19.90

## 2017-05-31 LAB — BASIC METABOLIC PANEL
ANION GAP: 7 (ref 5–15)
BUN: 18 mg/dL (ref 6–20)
CHLORIDE: 107 mmol/L (ref 101–111)
CO2: 23 mmol/L (ref 22–32)
Calcium: 9.4 mg/dL (ref 8.9–10.3)
Creatinine, Ser: 1.14 mg/dL (ref 0.61–1.24)
GFR calc Af Amer: >60 mL/min (ref >60)
GLUCOSE: 104 mg/dL — AB (ref 65–99)
Potassium: 4 mmol/L (ref 3.5–5.1)
Sodium: 137 mmol/L (ref 135–145)

## 2017-05-31 LAB — CBC
HEMATOCRIT: 42.7 % (ref 39.0–52.0)
HEMOGLOBIN: 14.3 g/dL (ref 13.0–17.0)
MCH: 29.5 pg (ref 26.0–34.0)
MCHC: 33.5 g/dL (ref 30.0–36.0)
MCV: 88 fL (ref 78.0–100.0)
Platelets: 186 10*3/uL (ref 150–400)
RBC: 4.85 MIL/uL (ref 4.22–5.81)
RDW: 15.3 % (ref 11.5–15.5)
WBC: 6.9 10*3/uL (ref 4.0–10.5)

## 2017-05-31 SURGERY — MULTIPLE EXTRACTION WITH ALVEOLOPLASTY
Anesthesia: General

## 2017-05-31 MED ORDER — CLINDAMYCIN PHOSPHATE 600 MG/50ML IV SOLN
INTRAVENOUS | Status: AC
Start: 2017-05-31 — End: 2017-05-31
  Filled 2017-05-31: qty 50

## 2017-05-31 MED ORDER — OXYMETAZOLINE HCL 0.05 % NA SOLN
NASAL | Status: DC | PRN
  Administered 2017-05-31 (×2): 2 via NASAL

## 2017-05-31 MED ORDER — BUPIVACAINE-EPINEPHRINE 0.5% -1:200000 IJ SOLN
INTRAMUSCULAR | Status: DC | PRN
  Administered 2017-05-31: 3.6 mL

## 2017-05-31 MED ORDER — OXYMETAZOLINE HCL 0.05 % NA SOLN
NASAL | Status: DC | PRN
  Administered 2017-05-31: 1 via TOPICAL

## 2017-05-31 MED ORDER — ONDANSETRON HCL 4 MG/2ML IJ SOLN
INTRAMUSCULAR | Status: DC | PRN
  Administered 2017-05-31: 4 mg via INTRAVENOUS

## 2017-05-31 MED ORDER — MIDAZOLAM HCL 5 MG/5ML IJ SOLN
INTRAMUSCULAR | Status: DC | PRN
  Administered 2017-05-31: 2 mg via INTRAVENOUS

## 2017-05-31 MED ORDER — FENTANYL CITRATE (PF) 100 MCG/2ML IJ SOLN
INTRAMUSCULAR | Status: DC | PRN
  Administered 2017-05-31 (×2): 25 ug via INTRAVENOUS
  Administered 2017-05-31: 100 ug via INTRAVENOUS

## 2017-05-31 MED ORDER — AMINOCAPROIC ACID SOLUTION 5% (50 MG/ML)
10.0000 mL | ORAL | Status: DC
  Filled 2017-05-31 (×2): qty 100

## 2017-05-31 MED ORDER — FENTANYL CITRATE (PF) 250 MCG/5ML IJ SOLN
INTRAMUSCULAR | Status: AC
  Filled 2017-05-31: qty 5

## 2017-05-31 MED ORDER — 0.9 % SODIUM CHLORIDE (POUR BTL) OPTIME
TOPICAL | Status: DC | PRN
  Administered 2017-05-31: 1000 mL

## 2017-05-31 MED ORDER — FENTANYL CITRATE (PF) 100 MCG/2ML IJ SOLN: 25.0000 ug | INTRAMUSCULAR | Status: DC | PRN

## 2017-05-31 MED ORDER — MIDAZOLAM HCL 2 MG/2ML IJ SOLN
INTRAMUSCULAR | Status: AC
  Filled 2017-05-31: qty 2

## 2017-05-31 MED ORDER — SUGAMMADEX SODIUM 200 MG/2ML IV SOLN
INTRAVENOUS | Status: DC | PRN
  Administered 2017-05-31: 200 mg via INTRAVENOUS

## 2017-05-31 MED ORDER — PHENYLEPHRINE HCL 10 MG/ML IJ SOLN
INTRAMUSCULAR | Status: DC | PRN
  Administered 2017-05-31 (×2): 120 ug via INTRAVENOUS
  Administered 2017-05-31: 80 ug via INTRAVENOUS
  Administered 2017-05-31: 40 ug via INTRAVENOUS
  Administered 2017-05-31: 120 ug via INTRAVENOUS
  Administered 2017-05-31: 80 ug via INTRAVENOUS
  Administered 2017-05-31: 40 ug via INTRAVENOUS
  Administered 2017-05-31 (×2): 160 ug via INTRAVENOUS
  Administered 2017-05-31: 120 ug via INTRAVENOUS
  Administered 2017-05-31: 40 ug via INTRAVENOUS
  Administered 2017-05-31: 120 ug via INTRAVENOUS

## 2017-05-31 MED ORDER — HEMOSTATIC AGENTS (NO CHARGE) OPTIME
TOPICAL | Status: DC | PRN
  Administered 2017-05-31: 1 via TOPICAL

## 2017-05-31 MED ORDER — OXYCODONE HCL 5 MG/5ML PO SOLN: 5.0000 mg | Freq: Once | ORAL | Status: DC | PRN

## 2017-05-31 MED ORDER — LIDOCAINE-EPINEPHRINE 2 %-1:100000 IJ SOLN
INTRAMUSCULAR | Status: DC | PRN
  Administered 2017-05-31: 6.8 mL

## 2017-05-31 MED ORDER — PROPOFOL 10 MG/ML IV BOLUS
INTRAVENOUS | Status: AC
  Filled 2017-05-31: qty 20

## 2017-05-31 MED ORDER — OXYCODONE HCL 5 MG PO TABS: 5.0000 mg | ORAL_TABLET | Freq: Once | ORAL | Status: DC | PRN

## 2017-05-31 MED ORDER — LIDOCAINE-EPINEPHRINE 2 %-1:100000 IJ SOLN
INTRAMUSCULAR | Status: AC
  Filled 2017-05-31: qty 10.2

## 2017-05-31 MED ORDER — LIDOCAINE HCL (CARDIAC) 20 MG/ML IV SOLN
INTRAVENOUS | Status: DC | PRN
  Administered 2017-05-31: 70 mg via INTRAVENOUS

## 2017-05-31 MED ORDER — PROPOFOL 10 MG/ML IV BOLUS
INTRAVENOUS | Status: DC | PRN
  Administered 2017-05-31: 190 mg via INTRAVENOUS

## 2017-05-31 MED ORDER — LACTATED RINGERS IV SOLN
INTRAVENOUS | Status: DC | PRN
  Administered 2017-05-31: 07:00:00 via INTRAVENOUS

## 2017-05-31 MED ORDER — ISOPROTERENOL HCL 0.2 MG/ML IJ SOLN: INTRAVENOUS | Status: DC | PRN

## 2017-05-31 MED ORDER — BUPIVACAINE-EPINEPHRINE (PF) 0.5% -1:200000 IJ SOLN
INTRAMUSCULAR | Status: AC
  Filled 2017-05-31: qty 3.6

## 2017-05-31 MED ORDER — OXYMETAZOLINE HCL 0.05 % NA SOLN
NASAL | Status: AC
  Filled 2017-05-31: qty 15

## 2017-05-31 MED ORDER — CLINDAMYCIN PHOSPHATE 600 MG/50ML IV SOLN
INTRAVENOUS | Status: DC | PRN
  Administered 2017-05-31: 600 mg via INTRAVENOUS

## 2017-05-31 MED ORDER — ROCURONIUM BROMIDE 100 MG/10ML IV SOLN
INTRAVENOUS | Status: DC | PRN
  Administered 2017-05-31: 50 mg via INTRAVENOUS

## 2017-05-31 SURGICAL SUPPLY — 37 items
ALCOHOL 70% 16 OZ (MISCELLANEOUS) ×3 IMPLANT
ATTRACTOMAT 16X20 MAGNETIC DRP (DRAPES) ×3 IMPLANT
BLADE SURG 15 STRL LF DISP TIS (BLADE) ×2 IMPLANT
BLADE SURG 15 STRL SS (BLADE) ×6
COVER SURGICAL LIGHT HANDLE (MISCELLANEOUS) ×3 IMPLANT
GAUZE PACKING FOLDED 2  STR (GAUZE/BANDAGES/DRESSINGS) ×2
GAUZE PACKING FOLDED 2 STR (GAUZE/BANDAGES/DRESSINGS) ×1 IMPLANT
GAUZE SPONGE 4X4 16PLY XRAY LF (GAUZE/BANDAGES/DRESSINGS) ×3 IMPLANT
GLOVE BIOGEL PI IND STRL 6 (GLOVE) ×1 IMPLANT
GLOVE BIOGEL PI INDICATOR 6 (GLOVE) ×2
GLOVE SURG ORTHO 8.0 STRL STRW (GLOVE) ×3 IMPLANT
GLOVE SURG SS PI 6.0 STRL IVOR (GLOVE) ×3 IMPLANT
GOWN STRL REUS W/ TWL LRG LVL3 (GOWN DISPOSABLE) ×1 IMPLANT
GOWN STRL REUS W/TWL 2XL LVL3 (GOWN DISPOSABLE) ×3 IMPLANT
GOWN STRL REUS W/TWL LRG LVL3 (GOWN DISPOSABLE) ×3
HEMOSTAT SURGICEL 2X14 (HEMOSTASIS) ×3 IMPLANT
KIT BASIN OR (CUSTOM PROCEDURE TRAY) ×3 IMPLANT
KIT ROOM TURNOVER OR (KITS) ×3 IMPLANT
MANIFOLD NEPTUNE WASTE (CANNULA) ×3 IMPLANT
NDL BLUNT 16X1.5 OR ONLY (NEEDLE) ×1 IMPLANT
NEEDLE BLUNT 16X1.5 OR ONLY (NEEDLE) ×3 IMPLANT
NS IRRIG 1000ML POUR BTL (IV SOLUTION) ×3 IMPLANT
PACK EENT II TURBAN DRAPE (CUSTOM PROCEDURE TRAY) ×3 IMPLANT
PAD ARMBOARD 7.5X6 YLW CONV (MISCELLANEOUS) ×3 IMPLANT
SPONGE SURGIFOAM ABS GEL 100 (HEMOSTASIS) ×2 IMPLANT
SPONGE SURGIFOAM ABS GEL 12-7 (HEMOSTASIS) IMPLANT
SPONGE SURGIFOAM ABS GEL SZ50 (HEMOSTASIS) IMPLANT
SUCTION FRAZIER HANDLE 10FR (MISCELLANEOUS) ×2
SUCTION TUBE FRAZIER 10FR DISP (MISCELLANEOUS) ×1 IMPLANT
SUT CHROMIC 3 0 PS 2 (SUTURE) ×6 IMPLANT
SUT CHROMIC 4 0 P 3 18 (SUTURE) IMPLANT
SYR 50ML SLIP (SYRINGE) ×3 IMPLANT
TOWEL OR 17X26 10 PK STRL BLUE (TOWEL DISPOSABLE) ×3 IMPLANT
TUBE CONNECTING 12'X1/4 (SUCTIONS) ×1
TUBE CONNECTING 12X1/4 (SUCTIONS) ×2 IMPLANT
WATER TABLETS ICX (MISCELLANEOUS) ×3 IMPLANT
YANKAUER SUCT BULB TIP NO VENT (SUCTIONS) ×3 IMPLANT

## 2017-05-31 NOTE — Anesthesia Preprocedure Evaluation (Signed)
Anesthesia Evaluation  Patient identified by MRN, date of birth, ID band Patient awake    Reviewed: Allergy & Precautions, NPO status , Patient's Chart, lab work & pertinent test results, reviewed documented beta blocker date and time   History of Anesthesia Complications Negative for: history of anesthetic complications  Airway Mallampati: II  TM Distance: >3 FB Neck ROM: Full    Dental  (+) Poor Dentition   Pulmonary shortness of breath, neg COPD, former smoker,    breath sounds clear to auscultation       Cardiovascular hypertension, Pt. on medications (-) angina+ CAD, + Past MI, + Cardiac Stents and +CHF  (-) dysrhythmias + Valvular Problems/Murmurs AS  Rhythm:Regular + Systolic murmurs    Neuro/Psych negative neurological ROS  negative psych ROS   GI/Hepatic negative GI ROS, Neg liver ROS,   Endo/Other  negative endocrine ROS  Renal/GU Renal InsufficiencyRenal disease     Musculoskeletal  (+) Arthritis ,   Abdominal   Peds  Hematology negative hematology ROS (+)   Anesthesia Other Findings EF 25%, Severe AS, RCA disease  Reproductive/Obstetrics                             Anesthesia Physical Anesthesia Plan  ASA: IV  Anesthesia Plan: General   Post-op Pain Management:    Induction: Intravenous  PONV Risk Score and Plan: 2 and Ondansetron and Dexamethasone  Airway Management Planned: Oral ETT  Additional Equipment:   Intra-op Plan:   Post-operative Plan: Extubation in OR  Informed Consent: I have reviewed the patients History and Physical, chart, labs and discussed the procedure including the risks, benefits and alternatives for the proposed anesthesia with the patient or authorized representative who has indicated his/her understanding and acceptance.   Dental advisory given  Plan Discussed with: CRNA and Surgeon  Anesthesia Plan Comments:         Anesthesia  Quick Evaluation

## 2017-05-31 NOTE — Op Note (Signed)
OPERATIVE REPORT  Patient:            Eugene Frazier Date of Birth:  Apr 25, 1956 MRN:                [id number]   DATE OF PROCEDURE:  05/31/2017  PREOPERATIVE DIAGNOSES: 1. Aortic stenosis 2. Pre-heart valve surgery dental protocol 3. Chronic apical periodontitis 4. Retained root segments 5. Dental caries 6. Chronic periodontitis 7. Accretions 8. Loose teeth.   POSTOPERATIVE DIAGNOSES: 1. Aortic stenosis 2. Pre-heart valve surgery dental protocol 3. Chronic apical periodontitis 4. Retained root segments 5. Dental caries 6. Chronic periodontitis 7. Accretions 8. Loose teeth   OPERATIONS: 1. Multiple extraction of tooth numbers 2, 3, 13, 14, 23, 24, 26, 30, and 31 2. 4 Quadrants of alveoloplasty 3. Gross debridement of remaining dentition   SURGEON: Charlynne Pander, DDS  ASSISTANT: Rory Percy, (dental assistant)  ANESTHESIA: General anesthesia via nasoendotracheal tube.  MEDICATIONS: 1. Clindamycin 600 mg IV prior to invasive dental procedures. 2. Local anesthesia with a total utilization of 4 carpules each containing 34 mg of lidocaine with 0.017 mg of epinephrine as well as 2 carpules each containing 9 mg of bupivacaine with 0.009 mg of epinephrine.  SPECIMENS: There are 9 teeth that were discarded.  DRAINS: None  CULTURES: None  COMPLICATIONS: None   ESTIMATED BLOOD LOSS: 75 mLs.  INTRAVENOUS FLUIDS: 800 mLs of Lactated ringers solution.  INDICATIONS: The patient was recently diagnosed with severe aortic stenosis.  A medically necessary dental consultation was then requested to evaluate poor dentition.  The patient was examined and treatment planned for multiple extractions with alveoloplasty and gross debridement of remaining dentition in the operating with general anesthesia.  This treatment plan was formulated to decrease the risks and complications associated with dental infection from affecting the patient's systemic health and anticipated heart  valve surgery.  OPERATIVE FINDINGS: Patient was examined operating room number 10.  The teeth were identified for extraction. The patient was noted be affected by chronic periodontitis, accretions, loose teeth, dental caries, chronic apical periodontitis, and retained root segments.   DESCRIPTION OF PROCEDURE: Patient was brought to the main operating room number 10. Patient was then placed in the supine position on the operating table. General anesthesia was then induced per the anesthesia team. The patient was then prepped and draped in the usual manner for dental medicine procedure. A timeout was performed. The patient was identified and procedures were verified. A throat pack was placed at this time. The oral cavity was then thoroughly examined with the findings noted above. The patient was then ready for dental medicine procedure as follows:  Local anesthesia was then administered sequentially with a total utilization of 4 carpules each containing 34 mg of lidocaine with 0.017 mg of epinephrine as well as 2 carpules  each containing 9 mg bupivacaine with 0.009 mg of epinephrine.  The Maxillary left and right quadrants first approached. Anesthesia was then delivered utilizing infiltration with lidocaine with epinephrine. A #15 blade incision was then made from the  distal of #2 and extended to the distal of #5.  A  surgical flap was then carefully reflected. The teeth were then subluxated with a series of straight elevators. Tooth numbers 2 and 3 were then removed with a 150 forceps without complications.  Alveoloplasty was then performed utilizing a ronguers and bone file. The surgical site was then irrigated with copious amounts of sterile saline. A piece of Surgifoam was placed in the extraction sockets appropriately.  The tissues were approximated and trimmed appropriately. The surgical site was then closed from the distal of #2 and extended the distal of #5 utilizing 3-0 chromic gut suture in a  continuous interrupted suture technique 1.   The maxillary left quadrant was then approached. A blade incision was made from the mesial number 16 and extended to the mesial of #12. A surgical flap was then carefully reflected. Tooth numbers 13 and 14 were then removed with a 150 forceps without complications. Alveoloplasty was then performed utilizing a rongeur and bone file. The tissues were approximated and trimmed appropriately. The surgical site was then irrigated with copious amounts sterile saline. A piece of Surgifoam was placed in the extraction sockets appropriately. The surgical site was then closed from the mesial #16 to the distal of #12 utilizing 3-0 chromic gut suture in a continuous interrupted suture technique 1.One interproximal suture was placed between tooth numbers 11 and 12 to further close the surgical site with 3-0 chromic gut material.  At this point time, the mandibular quadrants were approached. The patient was given bilateral inferior alveolar nerve blocks and long buccal nerve blocks utilizing the bupivacaine with epinephrine. Further infiltration was then achieved utilizing the lidocaine with epinephrine. A 15 blade incision was then made from the mesial #22 and extended to the mesial of #27. Second 15 blade incision was made from the distal of #31 and extended to the distal of #27. Surgical flaps were then carefully reflected.  Tooth numbers 23, 24, 26, 30, and 31 were then removed with a 151 forceps without complications. Alveoloplasty was then performed utilizing a rongeurs and bone file. The tissues were approximated and trimmed appropriately. The surgical sites were then irrigated with copious amounts of sterile saline. A piece of Surgifoam was placed the extraction sockets appropriately. The mandibular right surgical site was then closed from the distal of #31 and extended to the distal of #27 utilizing 3-0 chromic gut suture in a continuous interrupted suture technique 1.  The mandibular anterior surgical site was then closed from the mesial of #22 and extended to the mesial #27 utilizing 3-0 chromic gut suture in a continuous interrupted suture technique 1.  At this point time, the remaining dentition was approached. A sonic scaler was used to remove accretions. A series of hand curettes were then used to further refine removal of accretions. A sonic scaler was then again used to further refine removal of accretions. This completed the gross debridement procedure.  At this point time, the entire mouth was irrigated with copious amounts of sterile saline. The patient was examined for complications, seeing none, the dental medicine procedure was deemed to be complete. The throat pack was removed at this time. An oral airway was then placed at the request of the anesthesia team. A series of 4 x 4 gauze moistened with Amicar 5% rinse were placed in the mouth to aid hemostasis. The patient was then handed over to the anesthesia team for final disposition. After an appropriate amount of time, the patient was extubated and taken to the postanesthsia care unit in good condition. All counts were correct for the dental medicine procedure. Patient is to continue the Amicar 5% rinse post operatively. Patient is rinse with 10 ML's every hour for 10 hours in a swish and spit manner.   Charlynne Panderonald F. Kulinski, DDS.

## 2017-05-31 NOTE — H&P (Signed)
05/31/2017  Patient:            [facility] Date of Birth:  03/26/1956 MRN:                [id number]   BP 117/72   Pulse 91   Temp 98.3 F (36.8 C) (Oral)   Resp 20   SpO2 98%    [facility] presents for multiple dental extractions with alveoloplasty and gross debris remaining dentition the operating with general anesthesia. Patient denies any acute medical or dental changes. Please see note from [facility] on 05/21/2017 to act as the H&P for the dental operating room procedure.  [facility], DDS   Progress Notes Encounter Date: 05/21/2017 Eugene Nails, MD  Cardiothoracic Surgery    [] Hide copied text  [facility]  MULTIDISCIPLINARY HEART VALVE CLINIC  CARDIOTHORACIC SURGERY CONSULTATION REPORT  Referring Provider is Eugene Linden, MD PCP is Eugene Chimera, MD      Chief Complaint  Patient presents with  . Aortic Stenosis    Surgical eval, Cardiac Cath  and ECHO 05/09/17, Heart CT 05/17/2017    HPI:  Patient is a 61 year old male with history of aortic stenosis, coronary artery disease status post myocardial infarction in 2003 treated with PCI and stenting, and chronic systolic congestive heart failure who has been referred for surgical consultation to discuss treatment options for management of severe symptomatic aortic stenosis and coronary artery disease. The patient states that he developed acute shortness of breath in 2003 and was diagnosed with an acute myocardial infarction. He was treated with PCI and stenting involving "the arteries on the backside of my heart" at [facility]. Details of that procedure are not available. The patient recovered quickly and has not had any further cardiac problems until recently. Beginning last December the patient began to experience progressive symptoms of exertional shortness of breath, orthopnea, and severe lower extremity edema as well as abdominal swelling with scrotal edema. He  eventually reestablish care with [facility] who noted the presence of a systolic murmur on physical exam, diagnosed him with acute on chronic congestive heart failure and started the patient on Lasix. An echocardiogram was performed in early May that reportedly demonstrated the presence of moderate to severe left ventricular systolic dysfunction with ejection fraction estimated 30-35%, bicuspid aortic valve with severe aortic stenosis, mild mitral regurgitation, and mild pulmonary hypertension. The patient's symptoms improved considerably and he lost more than 50 pounds in weight. He was referred for cardiology consultation and evaluated by [facility] on 05/02/2017.  Repeat echocardiogram performed 05/09/2017 confirmed the presence of severe left ventricular systolic dysfunction and severe aortic stenosis. Left ventricular ejection fraction was estimated 25-30%. Peak velocity across the aortic valve measured greater than 3.9 m/s corresponding to mean transvalvular gradient estimated 41 mmHg. There was mild mitral regurgitation and moderate pulmonary hypertension.  Diagnostic cardiac catheterization was performed 05/09/2017 by [facility].  The patient was noted to have severe single-vessel coronary artery disease involving the right coronary artery, mild nonobstructive disease in the left anterior descending coronary artery and left circumflex coronary artery, and severe aortic stenosis with mean transvalvular gradient measured 37 mmHg corresponding to aortic valve area calculated 0.68 cm. There was severe global left ventricular systolic dysfunction with ejection fraction estimated less than 30%. Cardiac gated CT angiogram of the heart was performed and cardiothoracic surgical consultation was requested.  The patient is single and lives alone in [place]. He  is accompanied for his office visit by her close friend. He previously worked for [employer] but more recently works Production manager. He has remained physically active and without any significant physical limitations until recently. He states that he experienced progressive symptoms of exertional shortness of breath and fatigue that became noticeable back in December and ultimately prompted his presentation with severe symptoms of exertional shortness of breath, bilateral leg swelling, orthopnea, and abdominal distention. He is doing much better on medical therapy but he still gets short of breath and tired with exertion. He has not had any chest pain or chest tightness either with activity or at rest. He denies any dizzy spells or syncope.       Past Medical History:  Diagnosis Date  . Aortic stenosis   . Chronic systolic (congestive) heart failure (HCC)   . Coronary artery disease involving native coronary artery of native heart without angina pectoris   . Myocardial infarction [facility]) 2003         Past Surgical History:  Procedure Laterality Date  . CORONARY ANGIOPLASTY  2003  . RIGHT/LEFT HEART CATH AND CORONARY ANGIOGRAPHY N/A 05/09/2017   Procedure: Right/Left Heart Cath and Coronary Angiography;  Surgeon: Eugene Bollman, MD;  Location: [facility];  Service: Cardiovascular;  Laterality: N/A;         Family History  Problem Relation Age of Onset  . Alzheimer's disease Mother   . Lung disease Father     Social History        Social History  . Marital status: Single    Spouse name: N/A  . Number of children: N/A  . Years of education: N/A      Occupational History  . Not on file.        Social History Main Topics  . Smoking status: Former Smoker    Quit date: 05/02/1997  . Smokeless tobacco: Never Used  . Alcohol use Not on file  . Drug use: Unknown  . Sexual activity: Not on file       Other Topics Concern  . Not on file      Social History Narrative  . No narrative on file          Current Outpatient Prescriptions  Medication Sig Dispense Refill    . aspirin EC 81 MG tablet Take 1 tablet (81 mg total) by mouth daily.    . diphenhydrAMINE (BENADRYL) 25 MG tablet Take 25 mg by mouth every 6 (six) hours as needed for itching.    . furosemide (LASIX) 40 MG tablet Take 40 mg by mouth daily.     Marland Kitchen Ketotifen Fumarate (ALAWAY OP) Apply 1 drop to eye daily as needed (allergies).    . metoprolol succinate (TOPROL-XL) 25 MG 24 hr tablet Take 25mg s by mouth once daily     No current facility-administered medications for this visit.          Allergies  Allergen Reactions  . Penicillins Hives    Fever over 104  Has patient had a PCN reaction causing immediate rash, facial/tongue/throat swelling, SOB or lightheadedness with hypotension: No Has patient had a PCN reaction causing severe rash involving mucus membranes or skin necrosis: Yes Has patient had a PCN reaction that required hospitalization: No Has patient had a PCN reaction occurring within the last 10 years: No If all of the above answers are "NO", then may proceed with Cephalosporin use.       Review of Systems:  General:                      decreased appetite, decreased energy, no weight gain, + weight loss, no fever             Cardiac:                       no chest pain with exertion, no chest pain at rest, + SOB with exertion, no resting SOB, no PND, + orthopnea, no palpitations, no arrhythmia, no atrial fibrillation, + LE edema, no dizzy spells, no syncope             Respiratory:                 + shortness of breath, no home oxygen, + productive cough, no dry cough, no bronchitis, no wheezing, no hemoptysis, no asthma, no pain with inspiration or cough, no sleep apnea, no CPAP at night             GI:                               no difficulty swallowing, no reflux, no frequent heartburn, no hiatal hernia, no abdominal pain, no constipation, no diarrhea, no hematochezia, no hematemesis, no melena             GU:                               no dysuria,   frequency, no urinary tract infection, no hematuria, no enlarged prostate, + kidney stones, no kidney disease             Vascular:                     no pain suggestive of claudication, no pain in feet, no leg cramps, + varicose veins, no DVT, no non-healing foot ulcer             Neuro:                         no stroke, no TIA's, no seizures, no headaches, no temporary blindness one eye,  no slurred speech, + peripheral neuropathy, no chronic pain, no instability of gait, no memory/cognitive dysfunction             Musculoskeletal:         + mild arthritis, no joint swelling, no myalgias, no difficulty walking, normal mobility              Skin:                            + rash, + itching, no skin infections, no pressure sores or ulcerations             Psych:                         + anxiety, + depression, no nervousness, no unusual recent stress             Eyes:                           no blurry vision, no floaters, no recent vision  changes, + wears glasses or contacts             ENT:                            + hearing loss, + loose or painful teeth, + dentures, last saw dentist many years ago             Hematologic:               + easy bruising, no abnormal bleeding, no clotting disorder, no frequent epistaxis             Endocrine:                   no diabetes, does not check CBG's at home                                                       Physical Exam:              BP 112/72   Pulse 88   Resp 20   Ht 5\' 10"  (1.778 m)   Wt 196 lb (88.9 kg)   SpO2 97% Comment: RA  BMI 28.12 kg/m              General:                        well-appearing             HEENT:                       Unremarkable              Neck:                           no JVD, no bruits, no adenopathy              Chest:                          clear to auscultation, symmetrical breath sounds, no wheezes, no rhonchi              CV:                              RRR, grade III/VI  crescendo/decrescendo murmur heard best at LLSB,  no diastolic murmur             Abdomen:                    soft, non-tender, no masses              Extremities:                 warm, well-perfused, pulses palpable, no LE edema             Rectal/GU                   Deferred             Neuro:  Grossly non-focal and symmetrical throughout             Skin:                            Clean and dry, no rashes, no breakdown   Diagnostic Tests:  Transthoracic Echocardiography  Patient: [facility], [facility] MR #: [id number] Study Date: 05/09/2017 Gender: M Age: 61 Height: 177.8 cm Weight: 90.7 kg BSA: 2.14 m^2 Pt. Status: Room: [facility] Eugene Bollman, MD REFERRING [facility] Bollman, MD PERFORMING [facility], Inpatient SONOGRAPHER Leta Jungling, RDCS  cc:  ------------------------------------------------------------------- LV EF: 25% - 30%  ------------------------------------------------------------------- Indications: Aortic stenosis 424.1.  ------------------------------------------------------------------- History: PMH: Coronary artery disease. Congestive heart failure.  ------------------------------------------------------------------- Study Conclusions  - Left ventricle: The cavity size was normal. Wall thickness was normal. Systolic function was severely reduced. The estimated ejection fraction was in the range of 25% to 30%. Diffuse hypokinesis. Doppler parameters are consistent with restrictive physiology, indicative of decreased left ventricular diastolic compliance and/or increased left atrial pressure. E/medial e&' > 15, suggesting LVEDP at least 20 mmHg. - Aortic valve: Trileaflet; severely calcified leaflets. There was severe stenosis. Mean gradient (S): 41 mm Hg. Peak gradient (S): 62 mm Hg. Valve area (VTI): 0.85 cm^2. - Mitral valve: There was  mild regurgitation. - Left Eugene: The Eugene was mildly dilated. - Right ventricle: The cavity size was normal. Systolic function was moderately reduced. - Right Eugene: The Eugene was mildly dilated. - Pulmonary arteries: PA peak pressure: 49 mm Hg (S). - Systemic veins: IVC measured 2.3 cm with < 50% respirophasic variation, suggesting RA pressure 15 mmHg.  Impressions:  - Normal LV size with EF 25-30%, diffuse hypokinesis. Restrictive diastolic function suggestive of elevated LV filling pressure. Normal RV size with mild to moderately decreased systolic function. Severe aortic stenosis. Mild mitral regurgitation. Moderate pulmonary hypertension. Dilated IVC suggestive of elevated RV filling pressure.  ------------------------------------------------------------------- Study data: Comparison was made to the study of 02/15/2017. Study status: Routine. Procedure: The patient reported no pain pre or post test. Transthoracic echocardiography. Image quality was adequate. Study completion: There were no complications. Transthoracic echocardiography. M-mode, complete 2D, spectral Doppler, and color Doppler. Birthdate: Patient birthdate: August 02, 1956. Age: Patient is 61 yr old. Sex: Gender: male. BMI: 28.7 kg/m^2. Blood pressure: 151/49 Patient status: Inpatient. Study date: Study date: 05/09/2017. Study time: 01:40 PM. Location: Bedside.  -------------------------------------------------------------------  ------------------------------------------------------------------- Left ventricle: The cavity size was normal. Wall thickness was normal. Systolic function was severely reduced. The estimated ejection fraction was in the range of 25% to 30%. Diffuse hypokinesis. Doppler parameters are consistent with restrictive physiology, indicative of decreased left ventricular diastolic compliance and/or increased left atrial pressure. E/medial e&' >  15, suggesting LVEDP at least 20 mmHg.  ------------------------------------------------------------------- Aortic valve: Trileaflet; severely calcified leaflets. Doppler: There was severe stenosis. There was no regurgitation. VTI ratio of LVOT to aortic valve: 0.2. Valve area (VTI): 0.85 cm^2. Indexed valve area (VTI): 0.4 cm^2/m^2. Peak velocity ratio of LVOT to aortic valve: 0.16. Indexed valve area (Vmax): 0.3 cm^2/m^2. Mean velocity ratio of LVOT to aortic valve: 0.14. Indexed valve area (Vmean): 0.27 cm^2/m^2. Mean gradient (S): 41 mm Hg. Peak gradient (S): 62 mm Hg.  ------------------------------------------------------------------- Aorta: Aortic root: The aortic root was normal in size. Ascending aorta: The ascending aorta was normal in size.  ------------------------------------------------------------------- Mitral valve: Normal thickness leaflets . Doppler: There was no evidence for stenosis. There was mild regurgitation. Peak gradient (D): 3 mm  Hg.  ------------------------------------------------------------------- Left Eugene: The Eugene was mildly dilated.  ------------------------------------------------------------------- Right ventricle: The cavity size was normal. Systolic function was moderately reduced.  ------------------------------------------------------------------- Pulmonic valve: Structurally normal valve. Cusp separation was normal. Doppler: Transvalvular velocity was within the normal range. There was no regurgitation.  ------------------------------------------------------------------- Tricuspid valve: Doppler: There was mild regurgitation.  ------------------------------------------------------------------- Right Eugene: The Eugene was mildly dilated.  ------------------------------------------------------------------- Pericardium: There was no pericardial  effusion.  ------------------------------------------------------------------- Systemic veins: IVC measured 2.3 cm with <50% respirophasic variation, suggesting RA pressure 15 mmHg.  ------------------------------------------------------------------- Measurements  Left ventricle Value Reference Stroke volume, 2D 50 ml ---------- Stroke volume/bsa, 2D 23 ml/m^2 ---------- LV ejection fraction, 1-p A4C 36 % ---------- LV end-diastolic volume, 2-p 162 ml ---------- LV end-systolic volume, 2-p 102 ml ---------- LV ejection fraction, 2-p 37 % ---------- Stroke volume, 2-p 60 ml ---------- LV end-diastolic volume/bsa, 2-p 76 ml/m^2 ---------- LV end-systolic volume/bsa, 2-p 48 ml/m^2 ---------- Stroke volume/bsa, 2-p 28.1 ml/m^2 ---------- LV e&', lateral 8.11 cm/s ---------- LV E/e&', lateral 11.31 ---------- LV e&', medial 2.92 cm/s ---------- LV E/e&', medial 31.4 ---------- LV e&', average 5.52 cm/s ---------- LV E/e&', average 16.63 ----------  LVOT Value Reference LVOT ID, S 23 mm ---------- LVOT area 4.15 cm^2 ---------- LVOT peak velocity, S 60.9 cm/s ---------- LVOT mean velocity, S 41.7 cm/s ---------- LVOT VTI, S 12 cm ----------  Aortic valve Value  Reference Aortic valve peak velocity, S 392 cm/s ---------- Aortic valve mean velocity, S 303 cm/s ---------- Aortic valve VTI, S 58.6 cm ---------- Aortic mean gradient, S 40 mm Hg ---------- Aortic peak gradient, S 61 mm Hg ---------- VTI ratio, LVOT/AV 0.2 ---------- Aortic valve area, VTI 0.85 cm^2 ---------- Aortic valve area/bsa, VTI 0.4 cm^2/m^2 ---------- Velocity ratio, peak, LVOT/AV 0.16 ---------- Aortic valve area/bsa, peak 0.3 cm^2/m^2 ---------- velocity Velocity ratio, mean, LVOT/AV 0.14 ---------- Aortic valve area/bsa, mean 0.27 cm^2/m^2 ---------- velocity  Left Eugene Value Reference LA volume, S 45.2 ml ---------- LA volume/bsa, S 21.2 ml/m^2 ---------- LA volume, ES, 1-p A4C 51.3 ml ---------- LA volume/bsa, ES, 1-p A4C 24 ml/m^2 ---------- LA volume, ES, 1-p A2C 40 ml ---------- LA volume/bsa, ES, 1-p A2C 18.7 ml/m^2 ----------  Mitral valve Value Reference Mitral E-wave peak velocity 91.7 cm/s ---------- Mitral deceleration time (L) 134 ms 150 - 230 Mitral peak gradient, D 3 mm Hg ---------- Mitral regurg VTI, PISA 141 cm ----------  Pulmonary arteries Value Reference PA pressure, S, DP (H) 49 mm Hg <=30  Tricuspid valve Value Reference Tricuspid regurg peak velocity  293 cm/s ---------- Tricuspid peak RV-RA gradient 34 mm Hg ----------  Right Eugene Value Reference RA ID, S-I, ES, A4C (H) 57.8 mm 34 - 49 RA area, ES, A4C (H) 21.3 cm^2 8.3 - 19.5 RA volume, ES, A/L 65.2 ml ---------- RA volume/bsa, ES, A/L 30.5 ml/m^2 ----------  Right ventricle Value Reference RV ID, minor axis, ED, A4C base 35 mm ---------- TAPSE 10.1 mm ---------- RV s&', lateral, S 6.24 cm/s ----------  Legend: (L) and (H) mark values outside specified reference range.  ------------------------------------------------------------------- Prepared and Electronically Authenticated by  Marca Ancona, M.D. 2018-06-07T11:55:37   Right/Left Heart Cath and Coronary Angiography  Conclusion   1. Severe single-vessel coronary artery disease involving the RCA in this patient with a remote inferior wall infarct 2. Mild nonobstructive LAD and left circumflex stenosis 3. 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 Aortic valve hemodynamics with a mean gradient of 37 mmHg and calculated valve area of 0.68 cm 4. Severe  left ventricular systolic dysfunction with LVEF less than 30% and hemodynamics demonstrating elevated intracardiac filling pressures and preserved cardiac output  Plan: 2-D echocardiogram, cardiac surgical referral for consideration of treatment options of aortic stenosis in the context of CAD, severe cardiomyopathy, and heart failure  Indications   Severe aortic stenosis [I35.0 (ICD-10-CM)]  Procedural Details/Technique   Technical Details INDICATION: CHF, aortic stenosis. 61 year old gentleman with history of remote inferior wall MI treated emergently  with PCI to right coronary artery in 2003. He was lost to follow-up but recently was hospitalized at [facility] with heart failure. An echocardiogram reportedly showed severe LV dysfunction and severe aortic stenosis. He was evaluated as an outpatient by [facility] and referred for right and left heart catheterization.  PROCEDURAL DETAILS: There was an indwelling IV in a right antecubital vein. Using normal sterile technique, the IV was changed out for a 5 Fr brachial sheath over a 0.018 inch wire. The right wrist was then prepped, draped, and anesthetized with 1% lidocaine. Using the modified Seldinger technique a 5/6 French Slender sheath was placed in the right radial artery. Intra-arterial verapamil was administered through the radial artery sheath. IV heparin was administered after a JR4 catheter was advanced into the central aorta. A Swan-Ganz catheter was used for the right heart catheterization. Standard protocol was followed for recording of right heart pressures and sampling of oxygen saturations. Fick cardiac output was calculated. Standard Judkins catheters were used for selective coronary angiography and left ventriculography. The aortic valve was crossed with an AL-1 catheter directing a J-tip wire across the valve. A pullback gradient was measured across the aortic valve. There were no immediate procedural complications. The patient was transferred to the post catheterization recovery area for further monitoring.     Estimated blood loss <50 mL.  During this procedure the patient was administered the following to achieve and maintain moderate conscious sedation: Versed 1 mg, Fentanyl 25 mcg, while the patient's heart rate, blood pressure, and oxygen saturation were continuously monitored. The period of conscious sedation was 40 minutes, of which I was present face-to-face 100% of this time.    Coronary Findings   Dominance: Right  Left Anterior Descending  Prox LAD to Mid LAD  lesion, 40% stenosed.  Left Circumflex  Prox Cx to Mid Cx lesion, 50% stenosed. The lesion is mildly calcified. There is mild to moderate diffuse left circumflex stenosis throughout the mid vessel with no more than 50% luminal narrowing  Right Coronary Artery  Ost RCA to Prox RCA lesion, 75% stenosed. The lesion was previously treated. There are overlapping stents in the proximal right coronary artery hanging out from the ostium of the vessel into the aorta. The stented segment has 75% stenosis.  Mid RCA lesion, 80% stenosed. The lesion is moderately calcified. The entire RCA is diseased. There is diffuse calcification and severe stenosis in the mid vessel.  Right Posterior Descending Artery  Ost RPDA lesion, 90% stenosed. There is tight ostial stenosis in the PDA  Wall Motion              Left Heart   Left Ventricle The left ventricle is dilated. There is severe left ventricular systolic dysfunction. LV end diastolic pressure is severely elevated. The left ventricular ejection fraction is 25-35% by visual estimate. There are LV function abnormalities due to segmental dysfunction. There is severe LV dysfunction. The entire inferior wall from base to apex is akinetic. The anterolateral wall is hypokinetic. The LVEF is estimated at less than 30%.  Aortic Valve There is severe aortic valve stenosis. There is trivial (1+) aortic regurgitation. The aortic valve is calcified. There is restricted aortic valve motion. The aortic valve is calcified with restricted leaflet mobility. The valve has a bicuspid appearance. There is trivial aortic insufficiency. The mean transvalvular gradient is 37 mmHg. The peak to peak gradient is 48 mmHg and the calculated aortic valve area is 0.68 cm    Coronary Diagrams   Diagnostic Diagram       Implants        No implant documentation for this case.  PACS Images   Show images for Cardiac catheterization   Link to Procedure Log   Procedure  Log    Hemo Data    Most Recent Value  Fick Cardiac Output 4.67 L/min  Fick Cardiac Output Index 2.23 (L/min)/BSA  Aortic Mean Gradient 36.7 mmHg  Aortic Peak Gradient 48 mmHg  Aortic Valve Area 0.68  Aortic Value Area Index 0.33 cm2/BSA  RA A Wave 18 mmHg  RA V Wave 15 mmHg  RA Mean 12 mmHg  RV Systolic Pressure 54 mmHg  RV Diastolic Pressure 4 mmHg  RV EDP 18 mmHg  PA Systolic Pressure 55 mmHg  PA Diastolic Pressure 26 mmHg  PA Mean 38 mmHg  PW A Wave 26 mmHg  PW V Wave 33 mmHg  PW Mean 25 mmHg  AO Systolic Pressure 98 mmHg  AO Diastolic Pressure 67 mmHg  AO Mean 80 mmHg  LV Systolic Pressure 150 mmHg  LV Diastolic Pressure 12 mmHg  LV EDP 29 mmHg  Arterial Occlusion Pressure Extended Systolic Pressure 96 mmHg  Arterial Occlusion Pressure Extended Diastolic Pressure 60 mmHg  Arterial Occlusion Pressure Extended Mean Pressure 75 mmHg  Left Ventricular Apex Extended Systolic Pressure 144 mmHg  Left Ventricular Apex Extended Diastolic Pressure 12 mmHg  Left Ventricular Apex Extended EDP Pressure 27 mmHg  QP/QS 1  TPVR Index 17.01 HRUI  TSVR Index 35.83 HRUI  PVR SVR Ratio 0.19  TPVR/TSVR Ratio 0.47    Cardiac TAVR CT  TECHNIQUE: The patient was scanned on a Philips 256 scanner. A 120 kV retrospective scan was triggered in the descending thoracic aorta at 111 HU's. Gantry rotation speed was 270 msecs and collimation was .9 mm. 10 mg of iv Metoprolol and no nitro were given. The 3D data set was reconstructed in 5% intervals of the R-R cycle. Systolic and diastolic phases were analyzed on a dedicated work station using MPR, MIP and VRT modes. The patient received 80 cc of contrast.  FINDINGS: Aortic Valve: Left and right leaflet are partially co-joined, however the valve opens in a pattern of a tricuspid valve. There is severe thickening and calcification with moderate restriction in leaflet opening. There are no calcifications extending into  the LVOT.  Aorta: Normal size, no dissection, mild diffuse calcifications.  Sinotubular Junction: 28 x 26 mm  Ascending Thoracic Aorta: 33 x 33 mm  Aortic Arch: 29 x 27 mm  Descending Thoracic Aorta: 24 x 22 mm  Sinus of Valsalva Measurements:  Non-coronary: 31 mm  Right -coronary: 31 mm  Left -coronary: 30 mm  Coronary Artery Height above Annulus:  Left Main: 9 mm  Right Coronary: 15 mm  Virtual Basal Annulus Measurements:  Maximum/Minimum Diameter: 26 x 23 mm  Perimeter: 79 mm  Area: 477 mm  Optimum Fluoroscopic Angle for Delivery: RAO 2 CRA 1  IMPRESSION: 1. The aortic valve is severely thickened and calcified with partially co-joined left and right leaflet  opening in a pattern of a tricuspid valve. Annular measurements suitable for delivery of a 26 mm Edwards-SAPIEN 3 valve.  2. Borderline annulus to left main height for a 26 mm valve, however there are wide sinuses. Sufficient annulus to RCA distance.  3. Optimum Fluoroscopic Angle for Delivery: RAO 2 CRA 1  4. No thrombus in the left atrial appendage.  Tobias Alexander   Electronically Signed By: Tobias Alexander On: 05/18/2017 11:43      STS Risk Calculator  Procedure                                          AVR + CABG  Risk of Mortality                                1.9% Morbidity or Mortality                       17.4% Prolonged LOS                                   6.5% Short LOS                                           37.0% Permanent Stroke                             1.2% Prolonged Vent Support                     9.7% DSW Infection                                     0.4% Renal Failure                                       3.8% Reoperation                                        7.5%     Impression:  Patient has stage D severe symptomatic aortic stenosis with severe single-vessel coronary artery disease and chronic  systolic congestive heart failure. He presented with a gradual progression of symptoms of congestive heart failure over many months until 4-6 weeks ago when he eventually had advanced disease with severe volume overload and class IIIB symptoms of exertional shortness of breath, fatigue, abdominal swelling, and lower extremity edema. He has improved dramatically on medical therapy but he continues to experience exertional shortness of breath consistent with class II symptoms. I have personally reviewed the patient's recent transthoracic echocardiogram and diagnostic cardiac catheterization. The patient has severe global left ventricular systolic dysfunction with ejection fraction estimated less than 30%. 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 There is severe calcification, thickening, and restricted  leaflet mobility involving all 3 leaflets.  Despite the presence of severe left ventricular dysfunction, peak velocity across the aortic valve was measured greater than 3.9 m/s corresponding to mean transvalvular gradient estimated 41 mmHg. Diagnostic catheterization confirmed the presence of severe aortic stenosis and revealed severe single-vessel coronary artery disease involving the right coronary artery.  Patient also had elevated filling pressures but preserved cardiac output. I agree the patient needs aortic valve replacement. He would probably benefit from concomitant surgical revascularization of the right coronary artery. Risks associated with conventional surgery will be somewhat elevated because of the degree of left ventricular systolic dysfunction, but still relatively low.  Cardiac gated CT angiogram of the heart and aorta demonstrate anatomical characteristics consistent with severe aortic stenosis without any significant complicating features. However, the origin of the left main coronary artery is relatively low and  close to the aortic annulus.  He also has poor dentition with at least 2 loose teeth and will need dental extraction prior to surgery.    Plan:  The patient and his close friend were counseled at length regarding treatment alternatives for management of severe aortic stenosis including continued medical therapy versus proceeding with aortic valve replacement with or without coronary artery bypass grafting in the near future.  The natural history of aortic stenosis was reviewed, as was long term prognosis with medical therapy alone.  Surgical options were discussed at length including conventional surgical aortic valve replacement through either a full median sternotomy or using minimally invasive techniques.  Other alternatives including rapid-deployment bioprosthetic tissue valve replacement and transcatheter aortic valve replacement were discussed.  Discussion was held comparing the relative risks of mechanical valve replacement with need for lifelong anticoagulation versus use of a bioprosthetic tissue valve and the associated potential for late structural valve deterioration and failure.  This discussion was placed in the context of the patient's particular circumstances, and as a result the patient specifically requests that their valve be replaced using a bioprosthetic tissue valve .  The potential advantages and disadvantages associated with use of a rapid-deployment bioprosthetic aortic valve were discussed, including the risks of paravalvular leak, need for permanent pacemaker placement, and expectations for long-term durability.  The patient hopes to proceed with surgery as soon as possible. He will be referred for dental service consultation and likely dental extraction.  We tentatively plan to proceed with aortic valve replacement and coronary artery bypass grafting on 06/13/2017, presuming that his dental extraction can be accomplished during the interim period of time. The patient will return  to our office for follow-up on 06/11/2017. All of his questions have been addressed.   I spent in excess of 90 minutes during the conduct of this office consultation and >50% of this time involved direct face-to-face encounter with the patient for counseling and/or coordination of their care.    Salvatore Decent. Cornelius Moras, MD 05/21/2017 5:51 PM      Electronically signed by Eugene Nails, MD at 05/21/2017 6:14 PM Electronically signed by Eugene Nails, MD at 05/21/2017 6:16 PM

## 2017-05-31 NOTE — Anesthesia Postprocedure Evaluation (Signed)
Anesthesia Post Note  Patient: Eugene Frazier  Procedure(s) Performed: Procedure(s) (LRB): Extraction of tooth #'s 2,3,13,14,23,24,26,30 and 31 with alveoloplasty and gross debridement of remaining teeth (N/A)     Patient location during evaluation: PACU Anesthesia Type: General Level of consciousness: awake and alert Pain management: pain level controlled Vital Signs Assessment: post-procedure vital signs reviewed and stable Respiratory status: spontaneous breathing, nonlabored ventilation, respiratory function stable and patient connected to nasal cannula oxygen Cardiovascular status: stable Postop Assessment: no signs of nausea or vomiting Anesthetic complications: no    Last Vitals:  Vitals:   05/31/17 1000 05/31/17 1005  BP: 118/84 116/90  Pulse: 86 85  Resp: 11 18  Temp: 36.1 C     Last Pain:  Vitals:   05/31/17 0913  TempSrc:   PainSc: Asleep                 Godwin Tedesco

## 2017-05-31 NOTE — Anesthesia Procedure Notes (Signed)
Procedure Name: Intubation Date/Time: 05/31/2017 7:44 AM Performed by: Shirlyn Goltz Pre-anesthesia Checklist: Patient identified, Emergency Drugs available, Suction available and Patient being monitored Patient Re-evaluated:Patient Re-evaluated prior to inductionOxygen Delivery Method: Circle system utilized Preoxygenation: Pre-oxygenation with 100% oxygen Intubation Type: IV induction Ventilation: Mask ventilation without difficulty Laryngoscope Size: Mac and 4 Grade View: Grade I Nasal Tubes: Left, Nasal prep performed, Nasal Rae and Magill forceps- large, utilized Tube size: 7.0 mm Number of attempts: 2 (nasal tube would not pass through right nare) Airway Equipment and Method: Patient positioned with wedge pillow Placement Confirmation: ETT inserted through vocal cords under direct vision,  positive ETCO2 and breath sounds checked- equal and bilateral Tube secured with: Tape Dental Injury: Bloody posterior oropharynx

## 2017-05-31 NOTE — Progress Notes (Signed)
PRE-OPERATIVE NOTE:  05/31/2017 Eugene Frazier Eugene Frazier [id number]  VITALS: BP 117/72   Pulse 91   Temp 98.3 F (36.8 C) (Oral)   Resp 20   SpO2 98%   Lab Results  Component Value Date   WBC 7.6 05/03/2017   HGB 15.1 05/03/2017   HCT 45.5 05/03/2017   MCV 88.0 05/03/2017   PLT 229 05/03/2017   BMET    Component Value Date/Time   NA 138 05/03/2017 1220   K 3.8 05/03/2017 1220   CL 102 05/03/2017 1220   CO2 27 05/03/2017 1220   GLUCOSE 96 05/03/2017 1220   BUN 16 05/03/2017 1220   CREATININE 1.26 (H) 05/03/2017 1220   CALCIUM 9.5 05/03/2017 1220   GFRNONAA >60 05/03/2017 1220   GFRAA >60 05/03/2017 1220    Lab Results  Component Value Date   INR 1.15 05/03/2017   No results found for: PTT   Eugene Frazier presents for Multiple dental extractions with alveoloplasty and gross debridement of remaining dentition in the operative room with general anesthesia.   SUBJECTIVE: The patient denies any acute medical or dental changes and agre  ASSESSMENT: Patient is affected by chronic apical periodontitis, retained root segments, dental caries, chronic periodontitis, accretions, and loose teeth.  PLAN: Patient agrees to proceed with treatment as planned in the operating room as previously discussed and accepts the risks, benefits, and complications of the proposed treatment. Patient is aware of the risk for bleeding, bruising, swelling, infection, pain, nerve damage, soft tissue damage, damage to adjacent teeth, sinus involvement, root tip fracture, mandible fracture, and the risks of complications associated with the anesthesia. Patient also is aware of the potential for other complications up to and including death due to his overall cardiovascular and respiratory compromise.    Charlynne Panderonald F. Trystin Hargrove, DDS

## 2017-05-31 NOTE — Discharge Instructions (Signed)

## 2017-05-31 NOTE — Transfer of Care (Signed)
Immediate Anesthesia Transfer of Care Note  Patient: Eugene Frazier  Procedure(s) Performed: Procedure(s): Extraction of tooth #'s 2,3,13,14,23,24,26,30 and 31 with alveoloplasty and gross debridement of remaining teeth (N/A)  Patient Location: PACU  Anesthesia Type:General  Level of Consciousness: awake, alert , oriented and patient cooperative  Airway & Oxygen Therapy: Patient Spontanous Breathing and Patient connected to face mask oxygen  Post-op Assessment: Report given to RN and Post -op Vital signs reviewed and stable  Post vital signs: Reviewed and stable  Last Vitals:  Vitals:   05/31/17 0615 05/31/17 0913  BP: 117/72   Pulse: 91   Resp: 20   Temp: 36.8 C (P) 36.4 C    Last Pain:  Vitals:   05/31/17 0615  TempSrc: Oral      Patients Stated Pain Goal: 4 (05/31/17 [id number])  Complications: No apparent anesthesia complications

## 2017-06-01 ENCOUNTER — Encounter (HOSPITAL_COMMUNITY): Payer: Self-pay | Admitting: Dentistry

## 2017-06-11 ENCOUNTER — Encounter: Payer: Self-pay | Admitting: Thoracic Surgery (Cardiothoracic Vascular Surgery)

## 2017-06-11 ENCOUNTER — Ambulatory Visit (HOSPITAL_BASED_OUTPATIENT_CLINIC_OR_DEPARTMENT_OTHER)
Admission: RE | Admit: 2017-06-11 | Discharge: 2017-06-11 | Disposition: A | Discharge status: Home / Self Care | Payer: BLUE CROSS/BLUE SHIELD | Source: Ambulatory Visit | Attending: Thoracic Surgery (Cardiothoracic Vascular Surgery) | Admitting: Thoracic Surgery (Cardiothoracic Vascular Surgery)

## 2017-06-11 ENCOUNTER — Other Ambulatory Visit (HOSPITAL_COMMUNITY): Payer: BLUE CROSS/BLUE SHIELD

## 2017-06-11 ENCOUNTER — Ambulatory Visit (HOSPITAL_COMMUNITY)
Admission: RE | Admit: 2017-06-11 | Discharge: 2017-06-11 | Disposition: A | Discharge status: Home / Self Care | Payer: BLUE CROSS/BLUE SHIELD | Source: Ambulatory Visit | Attending: Thoracic Surgery (Cardiothoracic Vascular Surgery) | Admitting: Thoracic Surgery (Cardiothoracic Vascular Surgery)

## 2017-06-11 ENCOUNTER — Encounter (HOSPITAL_COMMUNITY): Payer: Self-pay | Admitting: Dentistry

## 2017-06-11 ENCOUNTER — Ambulatory Visit (INDEPENDENT_AMBULATORY_CARE_PROVIDER_SITE_OTHER): Payer: BLUE CROSS/BLUE SHIELD | Admitting: Thoracic Surgery (Cardiothoracic Vascular Surgery)

## 2017-06-11 ENCOUNTER — Ambulatory Visit (HOSPITAL_COMMUNITY): Payer: Self-pay | Admitting: Dentistry

## 2017-06-11 ENCOUNTER — Encounter (INDEPENDENT_AMBULATORY_CARE_PROVIDER_SITE_OTHER): Payer: Self-pay

## 2017-06-11 ENCOUNTER — Encounter (HOSPITAL_COMMUNITY): Payer: Self-pay

## 2017-06-11 ENCOUNTER — Encounter (HOSPITAL_COMMUNITY)
Admission: RE | Admit: 2017-06-11 | Discharge: 2017-06-11 | Disposition: A | Discharge status: Home / Self Care | Payer: BLUE CROSS/BLUE SHIELD | Source: Ambulatory Visit | Attending: Thoracic Surgery (Cardiothoracic Vascular Surgery) | Admitting: Thoracic Surgery (Cardiothoracic Vascular Surgery)

## 2017-06-11 ENCOUNTER — Other Ambulatory Visit (HOSPITAL_COMMUNITY): Payer: Self-pay | Admitting: Dentistry

## 2017-06-11 VITALS — BP 120/78 | HR 88 | Temp 98.4°F

## 2017-06-11 VITALS — BP 124/80 | HR 88 | Resp 20 | Ht 70.0 in | Wt 190.0 lb

## 2017-06-11 DIAGNOSIS — Z01818 Encounter for other preprocedural examination: Secondary | ICD-10-CM

## 2017-06-11 DIAGNOSIS — I700 Atherosclerosis of aorta: Secondary | ICD-10-CM | POA: Diagnosis not present

## 2017-06-11 DIAGNOSIS — Z01812 Encounter for preprocedural laboratory examination: Secondary | ICD-10-CM | POA: Insufficient documentation

## 2017-06-11 DIAGNOSIS — I350 Nonrheumatic aortic (valve) stenosis: Secondary | ICD-10-CM

## 2017-06-11 DIAGNOSIS — Z01810 Encounter for preprocedural cardiovascular examination: Secondary | ICD-10-CM | POA: Insufficient documentation

## 2017-06-11 DIAGNOSIS — K08199 Complete loss of teeth due to other specified cause, unspecified class: Secondary | ICD-10-CM

## 2017-06-11 DIAGNOSIS — I2510 Atherosclerotic heart disease of native coronary artery without angina pectoris: Secondary | ICD-10-CM

## 2017-06-11 DIAGNOSIS — I5022 Chronic systolic (congestive) heart failure: Secondary | ICD-10-CM

## 2017-06-11 LAB — VAS US DOPPLER PRE CABG
LCCADSYS: -76 cm/s
LCCAPDIAS: 14 cm/s
LCCAPSYS: 75 cm/s
LEFT ECA DIAS: -10 cm/s
LEFT VERTEBRAL DIAS: 10 cm/s
Left CCA dist dias: -20 cm/s
Left ICA dist dias: -23 cm/s
Left ICA dist sys: -56 cm/s
Left ICA prox dias: -23 cm/s
Left ICA prox sys: -64 cm/s
RCCADSYS: -68 cm/s
RCCAPDIAS: 10 cm/s
RCCAPSYS: 57 cm/s
RIGHT ECA DIAS: -4 cm/s
RIGHT VERTEBRAL DIAS: 4 cm/s

## 2017-06-11 LAB — PULMONARY FUNCTION TEST
DL/VA % PRED: 64 %
DL/VA: 2.96 ml/min/mmHg/L
DLCO UNC % PRED: 46 %
DLCO cor % pred: 44 %
DLCO cor: 14.52 ml/min/mmHg
DLCO unc: 14.88 ml/min/mmHg
FEF 25-75 PRE: 2.79 L/s
FEF 25-75 Post: 3.21 L/sec
FEF2575-%CHANGE-POST: 15 %
FEF2575-%Pred-Post: 109 %
FEF2575-%Pred-Pre: 95 %
FEV1-%Change-Post: 6 %
FEV1-%PRED-PRE: 75 %
FEV1-%Pred-Post: 80 %
FEV1-PRE: 2.71 L
FEV1-Post: 2.89 L
FEV1FVC-%Change-Post: 4 %
FEV1FVC-%Pred-Pre: 107 %
FEV6-%CHANGE-POST: 1 %
FEV6-%PRED-POST: 75 %
FEV6-%PRED-PRE: 74 %
FEV6-POST: 3.41 L
FEV6-PRE: 3.37 L
FEV6FVC-%Change-Post: 0 %
FEV6FVC-%PRED-POST: 104 %
FEV6FVC-%PRED-PRE: 105 %
FVC-%Change-Post: 1 %
FVC-%PRED-POST: 72 %
FVC-%Pred-Pre: 71 %
FVC-Post: 3.43 L
FVC-Pre: 3.37 L
POST FEV6/FVC RATIO: 100 %
Post FEV1/FVC ratio: 84 %
Pre FEV1/FVC ratio: 80 %
Pre FEV6/FVC Ratio: 100 %
RV % PRED: 95 %
RV: 2.15 L
TLC % PRED: 82 %
TLC: 5.79 L

## 2017-06-11 LAB — BLOOD GAS, ARTERIAL
Acid-base deficit: 0.2 mmol/L (ref 0.0–2.0)
Bicarbonate: 23.4 mmol/L (ref 20.0–28.0)
Drawn by: 205171
FIO2: 0.21
O2 Saturation: 97.4 %
PCO2 ART: 34.7 mmHg (ref 32.0–48.0)
PO2 ART: 99.2 mmHg (ref 83.0–108.0)
Patient temperature: 98.6
pH, Arterial: 7.443 (ref 7.350–7.450)

## 2017-06-11 LAB — URINALYSIS, ROUTINE W REFLEX MICROSCOPIC
BILIRUBIN URINE: NEGATIVE
GLUCOSE, UA: NEGATIVE mg/dL
HGB URINE DIPSTICK: NEGATIVE
Ketones, ur: NEGATIVE mg/dL
Leukocytes, UA: NEGATIVE
Nitrite: NEGATIVE
PH: 5 (ref 5.0–8.0)
Protein, ur: NEGATIVE mg/dL
SPECIFIC GRAVITY, URINE: 1.021 (ref 1.005–1.030)

## 2017-06-11 LAB — COMPREHENSIVE METABOLIC PANEL
ALBUMIN: 4.1 g/dL (ref 3.5–5.0)
ALT: 15 U/L — ABNORMAL LOW (ref 17–63)
ANION GAP: 10 (ref 5–15)
AST: 25 U/L (ref 15–41)
Alkaline Phosphatase: 126 U/L (ref 38–126)
BILIRUBIN TOTAL: 1.6 mg/dL — AB (ref 0.3–1.2)
BUN: 14 mg/dL (ref 6–20)
CO2: 25 mmol/L (ref 22–32)
Calcium: 10.1 mg/dL (ref 8.9–10.3)
Chloride: 104 mmol/L (ref 101–111)
Creatinine, Ser: 1.09 mg/dL (ref 0.61–1.24)
GLUCOSE: 123 mg/dL — AB (ref 65–99)
POTASSIUM: 3.5 mmol/L (ref 3.5–5.1)
Sodium: 139 mmol/L (ref 135–145)
TOTAL PROTEIN: 8.1 g/dL (ref 6.5–8.1)

## 2017-06-11 LAB — CBC
HEMATOCRIT: 46.7 % (ref 39.0–52.0)
Hemoglobin: 15.9 g/dL (ref 13.0–17.0)
MCH: 30.5 pg (ref 26.0–34.0)
MCHC: 34 g/dL (ref 30.0–36.0)
MCV: 89.6 fL (ref 78.0–100.0)
Platelets: 192 10*3/uL (ref 150–400)
RBC: 5.21 MIL/uL (ref 4.22–5.81)
RDW: 15.1 % (ref 11.5–15.5)
WBC: 7.6 10*3/uL (ref 4.0–10.5)

## 2017-06-11 LAB — SURGICAL PCR SCREEN
MRSA, PCR: NEGATIVE
Staphylococcus aureus: NEGATIVE

## 2017-06-11 LAB — PROTIME-INR
INR: 1.15
PROTHROMBIN TIME: 14.7 s (ref 11.4–15.2)

## 2017-06-11 LAB — TYPE AND SCREEN
ABO/RH(D): O POS
Antibody Screen: NEGATIVE

## 2017-06-11 LAB — ABO/RH: ABO/RH(D): O POS

## 2017-06-11 LAB — APTT: APTT: 28 s (ref 24–36)

## 2017-06-11 MED ORDER — ALBUTEROL SULFATE (2.5 MG/3ML) 0.083% IN NEBU
2.5000 mg | INHALATION_SOLUTION | Freq: Once | RESPIRATORY_TRACT | Status: AC
  Administered 2017-06-11: 2.5 mg via RESPIRATORY_TRACT

## 2017-06-11 MED ORDER — CHLORHEXIDINE GLUCONATE 0.12 % MT SOLN: OROMUCOSAL | 99 refills | Status: AC

## 2017-06-11 NOTE — Progress Notes (Addendum)
POST OPERATIVE NOTE:  06/11/2017 Eugene Frazier [id number]  VITALS: BP 120/78 (BP Location: Left Arm)   Pulse 88   Temp 98.4 F (36.9 C) (Oral)   LABS:  Lab Results  Component Value Date   WBC 6.9 05/31/2017   HGB 14.3 05/31/2017   HCT 42.7 05/31/2017   MCV 88.0 05/31/2017   PLT 186 05/31/2017   BMET    Component Value Date/Time   NA 137 05/31/2017 0633   K 4.0 05/31/2017 0633   CL 107 05/31/2017 0633   CO2 23 05/31/2017 0633   GLUCOSE 104 (H) 05/31/2017 0633   BUN 18 05/31/2017 0633   CREATININE 1.14 05/31/2017 0633   CALCIUM 9.4 05/31/2017 0633   GFRNONAA >60 05/31/2017 0633   GFRAA >60 05/31/2017 [id number]    Lab Results  Component Value Date   INR 1.15 05/03/2017   No results found for: PTT   Eugene Lawrenceoyle J Sawin is status post multiple extractions with alveoloplasty and gross debridement of remaining dentition in the operating room with general anesthesia on 05/31/2017. Patient now presents for evaluation of healing and suture removal. Patient with anticipated heart valve surgery later this week.  SUBJECTIVE: Patient with minimal discomfort. Patient still has several stitches that remain.   EXAM: There is no sign of infection, heme, or ooze. Patient is healing in by both generalized primary closure and secondary intention. Several sutures remain. Maxillary partial denture claspss in the area of #13 will be adjusted today by patient request.  Radiographic interpretation: Orthopantogram was taken. No obvious communication with the impacted tooth numbers 1, 17, and 32 Multiple missing teeth. Moderate to severe bone loss.  PROCEDURE: The patient was given a chlorhexidine gluconate rinse for 30 seconds. Sutures were then removed without complication. Patient tolerated the procedure well. Maxillary partial denture that had the clasps in the area #13 removed and polished as needed. Patient accepts results of the partial adjustment.  ASSESSMENT: Post operative course is  consistent with dental procedures performed in the operating room with general anesthesia. Loss of teeth due to extraction Multiple missing teeth Dental caries #10 as before  PLAN: 1. Use chlorhexidine gluconate rinses twice daily after breakfast and at bedtime as prescribed-today.  2. Continue salt water rinses as needed in between the chlorhexidine rinses to aid healing 4. Advance diet as tolerated 5. Patient is now cleared for heart valve surgery. 6. Patient follow-up with a dentist of his choice for exam, radiographs, and discussion of other dental treatment needs once he is medically stable from the anticipated heart valve surgery. 7. Patient will need antibiotic premedication prior to invasive dental procedures per American Heart Association guidelines. 8. Call if problems arise with healing.  Charlynne Panderonald F. Kulinski, DDS

## 2017-06-11 NOTE — Progress Notes (Signed)
[address]       [place] [id number]             [phone]     CARDIOTHORACIC SURGERY OFFICE NOTE  Referring Provider is Laqueta LindenKoneswaran, Suresh A, MD PCP is Richardean Chimeraaniel, Terry G, MD   HPI:  Patient returns to the office today for follow-up of aortic stenosis and coronary artery disease with plans to proceed with elective aortic valve replacement and coronary artery bypass grafting on Wednesday, 06/13/2017. He was originally seen in consultation on 05/21/2017. Since then he has remained clinically stable. He underwent dental extraction by Dr. Kristin BruinsKulinski and has been cleared for surgery. He reports no new problems or complaints.   Current Outpatient Prescriptions  Medication Sig Dispense Refill  . acetaminophen (TYLENOL) 325 MG tablet Take 650 mg by mouth every 4 (four) hours as needed for mild pain or moderate pain.    Marland Kitchen. aspirin EC 81 MG tablet Take 1 tablet (81 mg total) by mouth daily.    . chlorhexidine (PERIDEX) 0.12 % solution Rinse with 15 mls twice daily for 30 seconds. Use after breakfast and at bedtime. Spit out excess. Do not swallow. 480 mL prn  . diphenhydrAMINE (BENADRYL) 25 MG tablet Take 25 mg by mouth 2 (two) times daily as needed for itching.     . furosemide (LASIX) 40 MG tablet Take 40 mg by mouth daily.     Marland Kitchen. Ketotifen Fumarate (ALAWAY OP) Apply 1 drop to eye daily as needed (allergies).    . metoprolol succinate (TOPROL-XL) 25 MG 24 hr tablet Take 25 mg by mouth daily.     No current facility-administered medications for this visit.       Physical Exam:   BP 124/80   Pulse 88   Resp 20   Ht 5\' 10"  (1.778 m)   Wt 190 lb (86.2 kg)   SpO2 98%   BMI 27.26 kg/m   General:  Well-appearing  Chest:   Clear to auscultation  CV:   Regular rate and rhythm with systolic murmur  Incisions:  n/a  Abdomen:  Soft nontender  Extremities:  Warm and well-perfused  Diagnostic Tests:  n/a   Impression:  Patient has stage D severe symptomatic  aortic stenosis with severe single-vessel coronary artery disease and chronic systolic congestive heart failure. He presented with a gradual progression of symptoms of congestive heart failure over many months until 4-6 weeks ago when he eventually had advanced disease with severe volume overload and class IIIB symptoms of exertional shortness of breath, fatigue, abdominal swelling, and lower extremity edema. He has improved dramatically on medical therapy but he continues to experience exertional shortness of breath consistent with class II symptoms. I have personally reviewed the patient's recent transthoracic echocardiogram and diagnostic cardiac catheterization. The patient has severe global left ventricular systolic dysfunction with ejection fraction estimated less than 30%. 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 There is severe calcification, thickening, and restricted leaflet mobility involving all 3 leaflets.  Despite the presence of severe left ventricular dysfunction, peak velocity across the aortic valve was measured greater than 3.9 m/s corresponding to mean transvalvular gradient estimated 41 mmHg. Diagnostic catheterization confirmed the presence of severe aortic stenosis and revealed severe single-vessel coronary artery disease involving the right coronary artery.  Patient also had elevated filling pressures but preserved cardiac output. I agree the patient needs aortic valve replacement.  He would probably benefit from concomitant surgical revascularization of the right coronary artery. Risks associated with conventional surgery will be somewhat elevated because of the degree of left ventricular systolic dysfunction, but still relatively low.  Cardiac gated CT angiogram of the heart and aorta demonstrate anatomical characteristics consistent with severe aortic stenosis without any significant complicating features.  However, the origin of the left main coronary artery is relatively low and close to the aortic annulus.   Plan:  I have again reviewed the indications, risks, and potential benefits of surgery with the patient and his friend in the office today. Expectations for his postoperative convalescence at been discussed. All of his questions have been addressed. We plan to proceed with surgery on Wednesday morning as previously scheduled.  I spent in excess of 15 minutes during the conduct of this office consultation and >50% of this time involved direct face-to-face encounter with the patient for counseling and/or coordination of their care.    Salvatore Decent. Cornelius Moras, MD 06/11/2017 4:04 PM

## 2017-06-11 NOTE — Pre-Procedure Instructions (Signed)
Eugene LawrenceDoyle J Tompkins  06/11/2017      [facility] - [place], [place] - [address] [place] [id number] Phone: [phone] Fax: [phone]    Your procedure is scheduled on June 13, 2017  [facility] "A" at 6:30 A.M.  Call this number if you have problems the morning of surgery:  [phone]   Remember:  Do not eat food or drink liquids after midnight on June 12, 2017  Take these medicines the morning of surgery with A SIP OF WATER:  Metoprolol succinate (TOPROL-XL). If needed Acetaminophen (TYLENOL) and  Ketotifen Fumarate (ALAWAY OP). Patient will take Aspirin as directed by the surgeon. Stop taking all Vitamins, Fish oils, and Herbal medicaitons. Also stop all NSAIDS i.e. Advil, Motrin, Aleve, Anaprox, Naproxen, BC and Goody Powders.   Do not wear jewelry, make-up or nail polish.  Do not wear lotions, powders, or perfumes, or deoderant.  Do not shave 48 hours prior to surgery.  Men may shave face and neck.  Do not bring valuables to the hospital.  [facility] is not responsible for any belongings or valuables.  Contacts, dentures or bridgework may not be worn into surgery.  Leave your suitcase in the car.  After surgery it may be brought to your room.  For patients admitted to the hospital, discharge time will be determined by your treatment team.  Special instructions:    [facility]- Preparing For Surgery  Before surgery, you can play an important role. Because skin is not sterile, your skin needs to be as free of germs as possible. You can reduce the number of germs on your skin by washing with CHG (chlorahexidine gluconate) Soap before surgery.  CHG is an antiseptic cleaner which kills germs and bonds with the skin to continue killing germs even after washing.  Please do not use if you have an allergy to CHG or antibacterial soaps. If your skin becomes reddened/irritated stop using the CHG.   Do not shave (including legs and underarms) for at least 48 hours prior to first CHG shower. It is OK to shave your face.  Please follow these instructions carefully.   1. Shower the NIGHT BEFORE SURGERY and the MORNING OF SURGERY with CHG.   2. If you chose to wash your hair, wash your hair first as usual with your normal shampoo.  3. After you shampoo, rinse your hair and body thoroughly to remove the shampoo.  4. Use CHG as you would any other liquid soap. You can apply CHG directly to the skin and wash gently with a scrungie or a clean washcloth.   5. Apply the CHG Soap to your body ONLY FROM THE NECK DOWN.  Do not use on open wounds or open sores. Avoid contact with your eyes, ears, mouth and genitals (private parts). Wash genitals (private parts) with your normal soap.  6. Wash thoroughly, paying special attention to the area where your surgery will be performed.  7. Thoroughly rinse your body with warm water from the neck down.  8. DO NOT shower/wash with your normal soap after using and rinsing off the CHG Soap.  9. Pat yourself dry with a CLEAN TOWEL.   10. Wear CLEAN PAJAMAS   11. Place CLEAN SHEETS on your bed the night of your first shower and DO NOT SLEEP WITH PETS.  Day of Surgery: Do not apply any deodorants/lotions. Please wear clean clothes to the hospital/surgery center.  Please read over the following fact sheets that you were given. Pain Booklet, Coughing and Deep Breathing, MRSA Information and Surgical Site Infection Prevention

## 2017-06-11 NOTE — Progress Notes (Signed)
Pre-op Cardiac Surgery  Carotid Findings:  Bilat 40-59% ICA stenosis, antegrade vertebral flow.  Upper Extremity Right Left  Brachial Pressures 99 94  Radial Waveforms Tri Tri  Ulnar Waveforms Tri Tri  Palmar Arch (Allen's Test) Obliterates with radial compression, normal with ulnar compression Decreases <50% with radial compression, normal with ulnar compression      Lower  Extremity Right Left      Anterior Tibial 89, mono 97, bi  Posterior Tibial 80, mono 93, bi  Ankle/Brachial Indices 0.9 0.98    Findings:  RLE mild arterial disease. LLE within normal limits.  Farrel DemarkJill Eunice, RDMS, RVT 06/11/2017

## 2017-06-11 NOTE — Patient Instructions (Addendum)
PLAN: 1. Use chlorhexidine gluconate rinses twice daily after breakfast and at bedtime as prescribed-today.  2. Continue salt water rinses as needed in between the chlorhexidine rinses to aid healing 4. Advance diet as tolerated 5. Patient is now cleared for heart valve surgery. 6. Patient follow-up with a dentist of his choice for exam, radiographs, and discussion of other dental treatment needs once he is medically stable from the anticipated heart valve surgery. 7. Patient will need antibiotic premedication prior to invasive dental procedures per American Heart Association guidelines. 8. Call if problems arise with healing.  Charlynne Panderonald F. Torren Maffeo, DDS

## 2017-06-11 NOTE — Progress Notes (Signed)
PCP - Donzetta Sprungerry Daniel Cardiologist - Prentice DockerSuresh Koneswaran  Chest x-ray - 06/11/17 EKG - 06/11/17 Stress Test - Denies ECHO - 05/21/17 Cardiac Cath - 05/09/17  Sleep Study - Denies CPAP - None  Pt denies having chest pain, sob, or fever at this time. All instructions explained to the pt, with a verbal understanding of the material. Pt agrees to go over the instructions while at home for a better understanding. The opportunity to ask questions was provided.

## 2017-06-12 ENCOUNTER — Encounter (HOSPITAL_COMMUNITY): Payer: Self-pay | Admitting: Certified Registered Nurse Anesthetist

## 2017-06-12 LAB — HEMOGLOBIN A1C
Hgb A1c MFr Bld: 6.3 % — ABNORMAL HIGH (ref 4.8–5.6)
Mean Plasma Glucose: 134 mg/dL

## 2017-06-12 MED ORDER — LIDOCAINE HCL (CARDIAC) 20 MG/ML IV SOLN
260.0000 mg | Freq: Once | INTRAVENOUS | Status: DC
  Filled 2017-06-12: qty 13

## 2017-06-12 MED ORDER — VANCOMYCIN HCL 1000 MG IV SOLR
INTRAVENOUS | Status: AC
  Administered 2017-06-13: 500 mL
  Filled 2017-06-12: qty 1000

## 2017-06-12 MED ORDER — MAGNESIUM SULFATE 50 % IJ SOLN
40.0000 meq | INTRAMUSCULAR | Status: DC
  Filled 2017-06-12: qty 10

## 2017-06-12 MED ORDER — MANNITOL 20% IV SOLUTION 10G/50ML
6.4000 g | INTRAVENOUS | Status: DC
  Filled 2017-06-12: qty 60

## 2017-06-12 MED ORDER — NITROGLYCERIN IN D5W 200-5 MCG/ML-% IV SOLN
2.0000 ug/min | INTRAVENOUS | Status: DC
  Filled 2017-06-12: qty 250

## 2017-06-12 MED ORDER — SODIUM CHLORIDE 0.9 % IV SOLN
1500.0000 mg | INTRAVENOUS | Status: AC
  Administered 2017-06-13: 1500 mg via INTRAVENOUS
  Filled 2017-06-12: qty 1500

## 2017-06-12 MED ORDER — TRANEXAMIC ACID (OHS) BOLUS VIA INFUSION
15.0000 mg/kg | INTRAVENOUS | Status: AC
  Administered 2017-06-13: 1293 mg via INTRAVENOUS
  Filled 2017-06-12: qty 1293

## 2017-06-12 MED ORDER — PHENYLEPHRINE HCL 10 MG/ML IJ SOLN
30.0000 ug/min | INTRAMUSCULAR | Status: AC
  Administered 2017-06-13: 25 ug/min via INTRAVENOUS
  Filled 2017-06-12: qty 2

## 2017-06-12 MED ORDER — METOPROLOL TARTRATE 12.5 MG HALF TABLET
12.5000 mg | ORAL_TABLET | Freq: Once | ORAL | Status: AC
  Administered 2017-06-13: 12.5 mg via ORAL
  Filled 2017-06-12: qty 1

## 2017-06-12 MED ORDER — SODIUM CHLORIDE 0.9 % IV SOLN
INTRAVENOUS | Status: DC
  Filled 2017-06-12: qty 30

## 2017-06-12 MED ORDER — DEXMEDETOMIDINE HCL IN NACL 400 MCG/100ML IV SOLN
0.1000 ug/kg/h | INTRAVENOUS | Status: AC
  Administered 2017-06-13: .3 ug/kg/h via INTRAVENOUS
  Filled 2017-06-12: qty 100

## 2017-06-12 MED ORDER — INSULIN REGULAR HUMAN 100 UNIT/ML IJ SOLN
INTRAMUSCULAR | Status: AC
  Administered 2017-06-13: 1 [IU]/h via INTRAVENOUS
  Filled 2017-06-12: qty 1

## 2017-06-12 MED ORDER — EPINEPHRINE PF 1 MG/ML IJ SOLN
0.0000 ug/min | INTRAVENOUS | Status: DC
  Filled 2017-06-12: qty 4

## 2017-06-12 MED ORDER — POTASSIUM CHLORIDE 2 MEQ/ML IV SOLN
80.0000 meq | INTRAVENOUS | Status: DC
  Filled 2017-06-12: qty 40

## 2017-06-12 MED ORDER — DOPAMINE-DEXTROSE 3.2-5 MG/ML-% IV SOLN
0.0000 ug/kg/min | INTRAVENOUS | Status: DC
  Filled 2017-06-12: qty 250

## 2017-06-12 MED ORDER — PAPAVERINE HCL 30 MG/ML IJ SOLN
INTRAMUSCULAR | Status: AC
  Administered 2017-06-13: 500 mL
  Filled 2017-06-12: qty 2.5

## 2017-06-12 MED ORDER — TRANEXAMIC ACID (OHS) PUMP PRIME SOLUTION
2.0000 mg/kg | INTRAVENOUS | Status: DC
  Filled 2017-06-12: qty 1.72

## 2017-06-12 MED ORDER — LEVOFLOXACIN IN D5W 500 MG/100ML IV SOLN
500.0000 mg | INTRAVENOUS | Status: AC
  Administered 2017-06-13: 500 mg via INTRAVENOUS
  Filled 2017-06-12: qty 100

## 2017-06-12 MED ORDER — CHLORHEXIDINE GLUCONATE 0.12 % MT SOLN
15.0000 mL | Freq: Once | OROMUCOSAL | Status: AC
  Administered 2017-06-13: 15 mL via OROMUCOSAL
  Filled 2017-06-12: qty 15

## 2017-06-12 MED ORDER — TRANEXAMIC ACID 1000 MG/10ML IV SOLN
1.5000 mg/kg/h | INTRAVENOUS | Status: AC
  Administered 2017-06-13: 1.5 mg/kg/h via INTRAVENOUS
  Filled 2017-06-12: qty 25

## 2017-06-13 ENCOUNTER — Encounter (HOSPITAL_COMMUNITY): Payer: Self-pay | Admitting: Urology

## 2017-06-13 ENCOUNTER — Inpatient Hospital Stay (HOSPITAL_COMMUNITY): Payer: BLUE CROSS/BLUE SHIELD

## 2017-06-13 ENCOUNTER — Encounter (HOSPITAL_COMMUNITY)
Admission: RE | Disposition: A | Discharge status: Home / Self Care | Payer: Self-pay | Source: Ambulatory Visit | Attending: Thoracic Surgery (Cardiothoracic Vascular Surgery)

## 2017-06-13 ENCOUNTER — Inpatient Hospital Stay (HOSPITAL_COMMUNITY): Payer: BLUE CROSS/BLUE SHIELD | Admitting: Certified Registered Nurse Anesthetist

## 2017-06-13 ENCOUNTER — Ambulatory Visit: Payer: BLUE CROSS/BLUE SHIELD | Admitting: Cardiovascular Disease

## 2017-06-13 ENCOUNTER — Inpatient Hospital Stay (HOSPITAL_COMMUNITY)
Admission: RE | Admit: 2017-06-13 | Discharge: 2017-06-17 | DRG: 220 | Disposition: A | Discharge status: Home / Self Care | Payer: BLUE CROSS/BLUE SHIELD | Source: Ambulatory Visit | Attending: Thoracic Surgery (Cardiothoracic Vascular Surgery) | Admitting: Thoracic Surgery (Cardiothoracic Vascular Surgery)

## 2017-06-13 DIAGNOSIS — M1990 Unspecified osteoarthritis, unspecified site: Secondary | ICD-10-CM | POA: Diagnosis present

## 2017-06-13 DIAGNOSIS — Z87891 Personal history of nicotine dependence: Secondary | ICD-10-CM | POA: Diagnosis not present

## 2017-06-13 DIAGNOSIS — Q231 Congenital insufficiency of aortic valve: Principal | ICD-10-CM

## 2017-06-13 DIAGNOSIS — Z820 Family history of epilepsy and other diseases of the nervous system: Secondary | ICD-10-CM

## 2017-06-13 DIAGNOSIS — Z87442 Personal history of urinary calculi: Secondary | ICD-10-CM | POA: Diagnosis not present

## 2017-06-13 DIAGNOSIS — I272 Pulmonary hypertension, unspecified: Secondary | ICD-10-CM | POA: Diagnosis present

## 2017-06-13 DIAGNOSIS — I252 Old myocardial infarction: Secondary | ICD-10-CM

## 2017-06-13 DIAGNOSIS — Z7982 Long term (current) use of aspirin: Secondary | ICD-10-CM

## 2017-06-13 DIAGNOSIS — I110 Hypertensive heart disease with heart failure: Secondary | ICD-10-CM | POA: Diagnosis present

## 2017-06-13 DIAGNOSIS — Z955 Presence of coronary angioplasty implant and graft: Secondary | ICD-10-CM

## 2017-06-13 DIAGNOSIS — Z880 Allergy status to penicillin: Secondary | ICD-10-CM | POA: Diagnosis not present

## 2017-06-13 DIAGNOSIS — J9811 Atelectasis: Secondary | ICD-10-CM | POA: Diagnosis not present

## 2017-06-13 DIAGNOSIS — Z79899 Other long term (current) drug therapy: Secondary | ICD-10-CM | POA: Diagnosis not present

## 2017-06-13 DIAGNOSIS — D62 Acute posthemorrhagic anemia: Secondary | ICD-10-CM | POA: Diagnosis not present

## 2017-06-13 DIAGNOSIS — I2510 Atherosclerotic heart disease of native coronary artery without angina pectoris: Secondary | ICD-10-CM | POA: Diagnosis present

## 2017-06-13 DIAGNOSIS — D696 Thrombocytopenia, unspecified: Secondary | ICD-10-CM | POA: Diagnosis present

## 2017-06-13 DIAGNOSIS — K5900 Constipation, unspecified: Secondary | ICD-10-CM | POA: Diagnosis not present

## 2017-06-13 DIAGNOSIS — R0689 Other abnormalities of breathing: Secondary | ICD-10-CM | POA: Diagnosis not present

## 2017-06-13 DIAGNOSIS — R7303 Prediabetes: Secondary | ICD-10-CM | POA: Diagnosis present

## 2017-06-13 DIAGNOSIS — I5022 Chronic systolic (congestive) heart failure: Secondary | ICD-10-CM | POA: Diagnosis present

## 2017-06-13 DIAGNOSIS — I350 Nonrheumatic aortic (valve) stenosis: Secondary | ICD-10-CM

## 2017-06-13 DIAGNOSIS — I493 Ventricular premature depolarization: Secondary | ICD-10-CM | POA: Diagnosis present

## 2017-06-13 DIAGNOSIS — Z951 Presence of aortocoronary bypass graft: Secondary | ICD-10-CM

## 2017-06-13 DIAGNOSIS — R011 Cardiac murmur, unspecified: Secondary | ICD-10-CM | POA: Diagnosis present

## 2017-06-13 DIAGNOSIS — Z836 Family history of other diseases of the respiratory system: Secondary | ICD-10-CM | POA: Diagnosis not present

## 2017-06-13 DIAGNOSIS — Z953 Presence of xenogenic heart valve: Secondary | ICD-10-CM

## 2017-06-13 HISTORY — DX: Presence of xenogenic heart valve: Z95.3

## 2017-06-13 HISTORY — PX: AORTIC VALVE REPLACEMENT: SHX41

## 2017-06-13 HISTORY — PX: CORONARY ARTERY BYPASS GRAFT: SHX141

## 2017-06-13 HISTORY — PX: TEE WITHOUT CARDIOVERSION: SHX5443

## 2017-06-13 LAB — POCT I-STAT, CHEM 8
BUN: 18 mg/dL (ref 6–20)
BUN: 16 mg/dL (ref 6–20)
BUN: 17 mg/dL (ref 6–20)
BUN: 16 mg/dL (ref 6–20)
BUN: 16 mg/dL (ref 6–20)
BUN: 15 mg/dL (ref 6–20)
CALCIUM ION: 1.21 mmol/L (ref 1.15–1.40)
CALCIUM ION: 1.12 mmol/L — AB (ref 1.15–1.40)
CHLORIDE: 103 mmol/L (ref 101–111)
CHLORIDE: 103 mmol/L (ref 101–111)
CREATININE: 0.8 mg/dL (ref 0.61–1.24)
CREATININE: 0.9 mg/dL (ref 0.61–1.24)
Calcium, Ion: 1.24 mmol/L (ref 1.15–1.40)
Calcium, Ion: 1.11 mmol/L — ABNORMAL LOW (ref 1.15–1.40)
Calcium, Ion: 1.12 mmol/L — ABNORMAL LOW (ref 1.15–1.40)
Calcium, Ion: 1.15 mmol/L (ref 1.15–1.40)
Chloride: 101 mmol/L (ref 101–111)
Chloride: 101 mmol/L (ref 101–111)
Chloride: 101 mmol/L (ref 101–111)
Chloride: 105 mmol/L (ref 101–111)
Creatinine, Ser: 0.8 mg/dL (ref 0.61–1.24)
Creatinine, Ser: 0.8 mg/dL (ref 0.61–1.24)
Creatinine, Ser: 0.8 mg/dL (ref 0.61–1.24)
Creatinine, Ser: 0.9 mg/dL (ref 0.61–1.24)
GLUCOSE: 109 mg/dL — AB (ref 65–99)
GLUCOSE: 124 mg/dL — AB (ref 65–99)
Glucose, Bld: 115 mg/dL — ABNORMAL HIGH (ref 65–99)
Glucose, Bld: 115 mg/dL — ABNORMAL HIGH (ref 65–99)
Glucose, Bld: 136 mg/dL — ABNORMAL HIGH (ref 65–99)
Glucose, Bld: 129 mg/dL — ABNORMAL HIGH (ref 65–99)
HCT: 30 % — ABNORMAL LOW (ref 39.0–52.0)
HCT: 35 % — ABNORMAL LOW (ref 39.0–52.0)
HEMATOCRIT: 40 % (ref 39.0–52.0)
HEMATOCRIT: 33 % — AB (ref 39.0–52.0)
HEMATOCRIT: 40 % (ref 39.0–52.0)
HEMATOCRIT: 33 % — AB (ref 39.0–52.0)
HEMOGLOBIN: 13.6 g/dL (ref 13.0–17.0)
HEMOGLOBIN: 11.2 g/dL — AB (ref 13.0–17.0)
HEMOGLOBIN: 11.2 g/dL — AB (ref 13.0–17.0)
Hemoglobin: 13.6 g/dL (ref 13.0–17.0)
Hemoglobin: 10.2 g/dL — ABNORMAL LOW (ref 13.0–17.0)
Hemoglobin: 11.9 g/dL — ABNORMAL LOW (ref 13.0–17.0)
POTASSIUM: 3.8 mmol/L (ref 3.5–5.1)
POTASSIUM: 4.2 mmol/L (ref 3.5–5.1)
Potassium: 4.2 mmol/L (ref 3.5–5.1)
Potassium: 4.6 mmol/L (ref 3.5–5.1)
Potassium: 4.5 mmol/L (ref 3.5–5.1)
Potassium: 4.4 mmol/L (ref 3.5–5.1)
SODIUM: 141 mmol/L (ref 135–145)
SODIUM: 139 mmol/L (ref 135–145)
SODIUM: 141 mmol/L (ref 135–145)
SODIUM: 138 mmol/L (ref 135–145)
Sodium: 137 mmol/L (ref 135–145)
Sodium: 139 mmol/L (ref 135–145)
TCO2: 29 mmol/L (ref 0–100)
TCO2: 28 mmol/L (ref 0–100)
TCO2: 28 mmol/L (ref 0–100)
TCO2: 27 mmol/L (ref 0–100)
TCO2: 26 mmol/L (ref 0–100)
TCO2: 24 mmol/L (ref 0–100)

## 2017-06-13 LAB — POCT I-STAT 3, ART BLOOD GAS (G3+)
ACID-BASE EXCESS: 3 mmol/L — AB (ref 0.0–2.0)
ACID-BASE EXCESS: 2 mmol/L (ref 0.0–2.0)
Acid-Base Excess: 5 mmol/L — ABNORMAL HIGH (ref 0.0–2.0)
Acid-Base Excess: 5 mmol/L — ABNORMAL HIGH (ref 0.0–2.0)
Acid-base deficit: 2 mmol/L (ref 0.0–2.0)
Acid-base deficit: 2 mmol/L (ref 0.0–2.0)
BICARBONATE: 29.8 mmol/L — AB (ref 20.0–28.0)
BICARBONATE: 26.9 mmol/L (ref 20.0–28.0)
BICARBONATE: 28.5 mmol/L — AB (ref 20.0–28.0)
BICARBONATE: 23.6 mmol/L (ref 20.0–28.0)
Bicarbonate: 25 mmol/L (ref 20.0–28.0)
Bicarbonate: 25.2 mmol/L (ref 20.0–28.0)
O2 SAT: 100 %
O2 SAT: 100 %
O2 SAT: 100 %
O2 Saturation: 100 %
O2 Saturation: 97 %
O2 Saturation: 99 %
PCO2 ART: 44.9 mmHg (ref 32.0–48.0)
PCO2 ART: 38.8 mmHg (ref 32.0–48.0)
PCO2 ART: 52.8 mmHg — AB (ref 32.0–48.0)
PCO2 ART: 42.5 mmHg (ref 32.0–48.0)
PH ART: 7.474 — AB (ref 7.350–7.450)
PH ART: 7.286 — AB (ref 7.350–7.450)
PH ART: 7.354 (ref 7.350–7.450)
PO2 ART: 352 mmHg — AB (ref 83.0–108.0)
PO2 ART: 324 mmHg — AB (ref 83.0–108.0)
PO2 ART: 139 mmHg — AB (ref 83.0–108.0)
Patient temperature: 37
TCO2: 31 mmol/L (ref 0–100)
TCO2: 28 mmol/L (ref 0–100)
TCO2: 30 mmol/L (ref 0–100)
TCO2: 26 mmol/L (ref 0–100)
TCO2: 27 mmol/L (ref 0–100)
TCO2: 25 mmol/L (ref 0–100)
pCO2 arterial: 37.5 mmHg (ref 32.0–48.0)
pCO2 arterial: 33.3 mmHg (ref 32.0–48.0)
pH, Arterial: 7.43 (ref 7.350–7.450)
pH, Arterial: 7.464 — ABNORMAL HIGH (ref 7.350–7.450)
pH, Arterial: 7.483 — ABNORMAL HIGH (ref 7.350–7.450)
pO2, Arterial: 338 mmHg — ABNORMAL HIGH (ref 83.0–108.0)
pO2, Arterial: 257 mmHg — ABNORMAL HIGH (ref 83.0–108.0)
pO2, Arterial: 109 mmHg — ABNORMAL HIGH (ref 83.0–108.0)

## 2017-06-13 LAB — CBC
HEMATOCRIT: 38.3 % — AB (ref 39.0–52.0)
HEMATOCRIT: 37.6 % — AB (ref 39.0–52.0)
HEMOGLOBIN: 12.7 g/dL — AB (ref 13.0–17.0)
Hemoglobin: 13 g/dL (ref 13.0–17.0)
MCH: 30 pg (ref 26.0–34.0)
MCH: 30 pg (ref 26.0–34.0)
MCHC: 33.9 g/dL (ref 30.0–36.0)
MCHC: 33.8 g/dL (ref 30.0–36.0)
MCV: 88.5 fL (ref 78.0–100.0)
MCV: 88.7 fL (ref 78.0–100.0)
Platelets: 146 10*3/uL — ABNORMAL LOW (ref 150–400)
Platelets: 137 10*3/uL — ABNORMAL LOW (ref 150–400)
RBC: 4.33 MIL/uL (ref 4.22–5.81)
RBC: 4.24 MIL/uL (ref 4.22–5.81)
RDW: 14.9 % (ref 11.5–15.5)
RDW: 15 % (ref 11.5–15.5)
WBC: 14.7 10*3/uL — ABNORMAL HIGH (ref 4.0–10.5)
WBC: 13.5 10*3/uL — AB (ref 4.0–10.5)

## 2017-06-13 LAB — GLUCOSE, CAPILLARY
GLUCOSE-CAPILLARY: 107 mg/dL — AB (ref 65–99)
GLUCOSE-CAPILLARY: 105 mg/dL — AB (ref 65–99)
GLUCOSE-CAPILLARY: 127 mg/dL — AB (ref 65–99)
GLUCOSE-CAPILLARY: 127 mg/dL — AB (ref 65–99)
GLUCOSE-CAPILLARY: 122 mg/dL — AB (ref 65–99)
Glucose-Capillary: 126 mg/dL — ABNORMAL HIGH (ref 65–99)
Glucose-Capillary: 79 mg/dL (ref 65–99)
Glucose-Capillary: 126 mg/dL — ABNORMAL HIGH (ref 65–99)
Glucose-Capillary: 147 mg/dL — ABNORMAL HIGH (ref 65–99)

## 2017-06-13 LAB — APTT: aPTT: 37 seconds — ABNORMAL HIGH (ref 24–36)

## 2017-06-13 LAB — POCT I-STAT 4, (NA,K, GLUC, HGB,HCT)
GLUCOSE: 115 mg/dL — AB (ref 65–99)
HEMATOCRIT: 36 % — AB (ref 39.0–52.0)
Hemoglobin: 12.2 g/dL — ABNORMAL LOW (ref 13.0–17.0)
Potassium: 4.1 mmol/L (ref 3.5–5.1)
Sodium: 141 mmol/L (ref 135–145)

## 2017-06-13 LAB — ECHO TEE
AOPV: 0.75 m/s
AV Area VTI index: 1.61 cm2/m2
AV Area mean vel: 2.37 cm2
AV Mean grad: 10 mmHg
AV VEL mean LVOT/AV: 0.62
AV pk vel: 193 cm/s
AVA: 3.29 cm2
AVAREAMEANVIN: 1.16 cm2/m2
AVAREAVTI: 2.84 cm2
AVLVOTPG: 8 mmHg
AVPG: 15 mmHg
CHL CUP AV PEAK INDEX: 1.39
CHL CUP AV VALUE AREA INDEX: 1.61
CHL CUP AV VEL: 3.29
CHL CUP LVOT MV VTI INDEX: 2.13 cm2/m2
CHL CUP LVOT MV VTI: 4.34
CHL CUP MV M VEL: 49.6
DOP CAL AO MEAN VELOCITY: 133 cm/s
FS: 8 % — AB (ref 28–44)
LDCA: 3.8 cm2
LVOT SV: 115 mL
LVOT VTI: 30.3 cm
LVOT peak VTI: 0.87 cm
LVOT peak vel: 144 cm/s
LVOTD: 22 mm
MV Annulus VTI: 26.5 cm
MVG: 1 mmHg
VTI: 35 cm

## 2017-06-13 LAB — POCT I-STAT 3, VENOUS BLOOD GAS (G3P V)
Acid-base deficit: 2 mmol/L (ref 0.0–2.0)
Bicarbonate: 24.1 mmol/L (ref 20.0–28.0)
O2 SAT: 93 %
PCO2 VEN: 43.1 mmHg — AB (ref 44.0–60.0)
PO2 VEN: 69 mmHg — AB (ref 32.0–45.0)
TCO2: 25 mmol/L (ref 0–100)
pH, Ven: 7.355 (ref 7.250–7.430)

## 2017-06-13 LAB — HEMOGLOBIN AND HEMATOCRIT, BLOOD
HCT: 32.5 % — ABNORMAL LOW (ref 39.0–52.0)
Hemoglobin: 10.9 g/dL — ABNORMAL LOW (ref 13.0–17.0)

## 2017-06-13 LAB — PLATELET COUNT: PLATELETS: 141 10*3/uL — AB (ref 150–400)

## 2017-06-13 LAB — CREATININE, SERUM: CREATININE: 1.08 mg/dL (ref 0.61–1.24)

## 2017-06-13 LAB — PROTIME-INR
INR: 1.55
Prothrombin Time: 18.8 seconds — ABNORMAL HIGH (ref 11.4–15.2)

## 2017-06-13 LAB — MAGNESIUM: Magnesium: 2.9 mg/dL — ABNORMAL HIGH (ref 1.7–2.4)

## 2017-06-13 SURGERY — REPLACEMENT, AORTIC VALVE, OPEN
Anesthesia: General | Site: Chest

## 2017-06-13 MED ORDER — SODIUM CHLORIDE 0.9 % IV SOLN
0.0000 ug/kg/h | INTRAVENOUS | Status: DC
  Administered 2017-06-13: 0.4 ug/kg/h via INTRAVENOUS
  Filled 2017-06-13 (×2): qty 2

## 2017-06-13 MED ORDER — CHLORHEXIDINE GLUCONATE 4 % EX LIQD: 30.0000 mL | CUTANEOUS | Status: DC

## 2017-06-13 MED ORDER — LACTATED RINGERS IV SOLN: 500.0000 mL | Freq: Once | INTRAVENOUS | Status: DC | PRN

## 2017-06-13 MED ORDER — CHLORHEXIDINE GLUCONATE 0.12 % MT SOLN
15.0000 mL | OROMUCOSAL | Status: AC
  Administered 2017-06-13: 15 mL via OROMUCOSAL

## 2017-06-13 MED ORDER — ROCURONIUM BROMIDE 100 MG/10ML IV SOLN
INTRAVENOUS | Status: DC | PRN
  Administered 2017-06-13: 50 mg via INTRAVENOUS
  Administered 2017-06-13: 60 mg via INTRAVENOUS
  Administered 2017-06-13: 40 mg via INTRAVENOUS
  Administered 2017-06-13: 20 mg via INTRAVENOUS

## 2017-06-13 MED ORDER — BISACODYL 5 MG PO TBEC
10.0000 mg | DELAYED_RELEASE_TABLET | Freq: Every day | ORAL | Status: DC
  Administered 2017-06-14 – 2017-06-16 (×3): 10 mg via ORAL
  Filled 2017-06-13 (×3): qty 2

## 2017-06-13 MED ORDER — PANTOPRAZOLE SODIUM 40 MG PO TBEC
40.0000 mg | DELAYED_RELEASE_TABLET | Freq: Every day | ORAL | Status: DC
  Administered 2017-06-15 – 2017-06-17 (×3): 40 mg via ORAL
  Filled 2017-06-13 (×3): qty 1

## 2017-06-13 MED ORDER — SODIUM CHLORIDE 0.9 % IV SOLN: 250.0000 mL | INTRAVENOUS | Status: DC

## 2017-06-13 MED ORDER — HEPARIN SODIUM (PORCINE) 1000 UNIT/ML IJ SOLN
INTRAMUSCULAR | Status: DC | PRN
Start: 2017-06-13 — End: 2017-06-13
  Administered 2017-06-13: 33000 [IU] via INTRAVENOUS

## 2017-06-13 MED ORDER — FENTANYL CITRATE (PF) 250 MCG/5ML IJ SOLN
INTRAMUSCULAR | Status: AC
  Filled 2017-06-13: qty 25

## 2017-06-13 MED ORDER — PROPOFOL 10 MG/ML IV BOLUS
INTRAVENOUS | Status: AC
  Filled 2017-06-13: qty 20

## 2017-06-13 MED ORDER — SODIUM CHLORIDE 0.9 % IV SOLN
INTRAVENOUS | Status: DC
  Administered 2017-06-13: 14:00:00 via INTRAVENOUS

## 2017-06-13 MED ORDER — HEMOSTATIC AGENTS (NO CHARGE) OPTIME
TOPICAL | Status: DC | PRN
  Administered 2017-06-13: 3 via TOPICAL

## 2017-06-13 MED ORDER — SODIUM CHLORIDE 0.9 % IV SOLN
0.0000 ug/min | INTRAVENOUS | Status: DC
  Administered 2017-06-13: 50 ug/min via INTRAVENOUS
  Filled 2017-06-13: qty 2

## 2017-06-13 MED ORDER — ACETAMINOPHEN 160 MG/5ML PO SOLN: 1000.0000 mg | Freq: Four times a day (QID) | ORAL | Status: DC

## 2017-06-13 MED ORDER — SODIUM CHLORIDE 0.9 % IR SOLN
Status: DC | PRN
  Administered 2017-06-13: 2000 mL
  Administered 2017-06-13: 3000 mL

## 2017-06-13 MED ORDER — METOPROLOL TARTRATE 12.5 MG HALF TABLET
12.5000 mg | ORAL_TABLET | Freq: Two times a day (BID) | ORAL | Status: DC
  Administered 2017-06-13 – 2017-06-14 (×3): 12.5 mg via ORAL
  Filled 2017-06-13 (×3): qty 1

## 2017-06-13 MED ORDER — FENTANYL CITRATE (PF) 250 MCG/5ML IJ SOLN
INTRAMUSCULAR | Status: DC | PRN
  Administered 2017-06-13: 200 ug via INTRAVENOUS
  Administered 2017-06-13: 150 ug via INTRAVENOUS
  Administered 2017-06-13: 100 ug via INTRAVENOUS
  Administered 2017-06-13: 50 ug via INTRAVENOUS
  Administered 2017-06-13: 450 ug via INTRAVENOUS
  Administered 2017-06-13 (×2): 150 ug via INTRAVENOUS

## 2017-06-13 MED ORDER — ASPIRIN EC 325 MG PO TBEC
325.0000 mg | DELAYED_RELEASE_TABLET | Freq: Every day | ORAL | Status: DC
  Administered 2017-06-14: 325 mg via ORAL
  Filled 2017-06-13: qty 1

## 2017-06-13 MED ORDER — TRAMADOL HCL 50 MG PO TABS
50.0000 mg | ORAL_TABLET | ORAL | Status: DC | PRN
  Administered 2017-06-14: 100 mg via ORAL
  Filled 2017-06-13: qty 2

## 2017-06-13 MED ORDER — LACTATED RINGERS IV SOLN
INTRAVENOUS | Status: DC | PRN
  Administered 2017-06-13: 08:00:00 via INTRAVENOUS

## 2017-06-13 MED ORDER — MIDAZOLAM HCL 2 MG/2ML IJ SOLN: 2.0000 mg | INTRAMUSCULAR | Status: DC | PRN

## 2017-06-13 MED ORDER — ORAL CARE MOUTH RINSE
15.0000 mL | OROMUCOSAL | Status: DC
  Administered 2017-06-13: 15 mL via OROMUCOSAL

## 2017-06-13 MED ORDER — SODIUM CHLORIDE 0.9% FLUSH
3.0000 mL | INTRAVENOUS | Status: DC | PRN
  Administered 2017-06-14: 3 mL via INTRAVENOUS
  Filled 2017-06-13: qty 3

## 2017-06-13 MED ORDER — NITROGLYCERIN IN D5W 200-5 MCG/ML-% IV SOLN: 0.0000 ug/min | INTRAVENOUS | Status: DC

## 2017-06-13 MED ORDER — LEVOFLOXACIN IN D5W 750 MG/150ML IV SOLN
750.0000 mg | INTRAVENOUS | Status: AC
  Administered 2017-06-14: 750 mg via INTRAVENOUS
  Filled 2017-06-13: qty 150

## 2017-06-13 MED ORDER — ASPIRIN 81 MG PO CHEW: 324.0000 mg | CHEWABLE_TABLET | Freq: Every day | ORAL | Status: DC

## 2017-06-13 MED ORDER — SODIUM CHLORIDE 0.9 % IV SOLN
INTRAVENOUS | Status: AC
  Administered 2017-06-13: 14:00:00 via INTRAVENOUS

## 2017-06-13 MED ORDER — ORAL CARE MOUTH RINSE
15.0000 mL | Freq: Two times a day (BID) | OROMUCOSAL | Status: DC
  Administered 2017-06-13: 15 mL via OROMUCOSAL

## 2017-06-13 MED ORDER — DOCUSATE SODIUM 100 MG PO CAPS
200.0000 mg | ORAL_CAPSULE | Freq: Every day | ORAL | Status: DC
  Administered 2017-06-14 – 2017-06-16 (×3): 200 mg via ORAL
  Filled 2017-06-13 (×3): qty 2

## 2017-06-13 MED ORDER — MAGNESIUM SULFATE 4 GM/100ML IV SOLN
4.0000 g | Freq: Once | INTRAVENOUS | Status: AC
  Administered 2017-06-13: 4 g via INTRAVENOUS
  Filled 2017-06-13: qty 100

## 2017-06-13 MED ORDER — ONDANSETRON HCL 4 MG/2ML IJ SOLN: 4.0000 mg | Freq: Four times a day (QID) | INTRAMUSCULAR | Status: DC | PRN

## 2017-06-13 MED ORDER — LACTATED RINGERS IV SOLN: INTRAVENOUS | Status: DC

## 2017-06-13 MED ORDER — MORPHINE SULFATE (PF) 4 MG/ML IV SOLN
1.0000 mg | INTRAVENOUS | Status: DC | PRN
  Administered 2017-06-15: 2 mg via INTRAVENOUS
  Filled 2017-06-13: qty 1

## 2017-06-13 MED ORDER — ACETAMINOPHEN 160 MG/5ML PO SOLN: 650.0000 mg | Freq: Once | ORAL | Status: AC

## 2017-06-13 MED ORDER — MORPHINE SULFATE (PF) 2 MG/ML IV SOLN: 1.0000 mg | INTRAVENOUS | Status: DC | PRN

## 2017-06-13 MED ORDER — CHLORHEXIDINE GLUCONATE 0.12% ORAL RINSE (MEDLINE KIT): 15.0000 mL | Freq: Two times a day (BID) | OROMUCOSAL | Status: DC

## 2017-06-13 MED ORDER — CHLORHEXIDINE GLUCONATE CLOTH 2 % EX PADS
6.0000 | MEDICATED_PAD | Freq: Every day | CUTANEOUS | Status: DC
  Administered 2017-06-13 – 2017-06-14 (×2): 6 via TOPICAL

## 2017-06-13 MED ORDER — POTASSIUM CHLORIDE 10 MEQ/50ML IV SOLN: 10.0000 meq | INTRAVENOUS | Status: AC

## 2017-06-13 MED ORDER — ACETAMINOPHEN 500 MG PO TABS
1000.0000 mg | ORAL_TABLET | Freq: Four times a day (QID) | ORAL | Status: DC
  Administered 2017-06-13 – 2017-06-17 (×12): 1000 mg via ORAL
  Filled 2017-06-13 (×15): qty 2

## 2017-06-13 MED ORDER — MIDAZOLAM HCL 10 MG/2ML IJ SOLN
INTRAMUSCULAR | Status: AC
  Filled 2017-06-13: qty 2

## 2017-06-13 MED ORDER — PHENYLEPHRINE HCL 10 MG/ML IJ SOLN
0.0000 ug/min | INTRAMUSCULAR | Status: DC
  Administered 2017-06-14: 80 ug/min via INTRAVENOUS
  Filled 2017-06-13 (×2): qty 4

## 2017-06-13 MED ORDER — FAMOTIDINE IN NACL 20-0.9 MG/50ML-% IV SOLN
20.0000 mg | Freq: Two times a day (BID) | INTRAVENOUS | Status: AC
  Administered 2017-06-13: 20 mg via INTRAVENOUS

## 2017-06-13 MED ORDER — SODIUM CHLORIDE 0.45 % IV SOLN
INTRAVENOUS | Status: DC | PRN
  Administered 2017-06-13: 14:00:00 via INTRAVENOUS

## 2017-06-13 MED ORDER — ETOMIDATE 2 MG/ML IV SOLN
INTRAVENOUS | Status: DC | PRN
  Administered 2017-06-13: 8 mg via INTRAVENOUS

## 2017-06-13 MED ORDER — ACETAMINOPHEN 650 MG RE SUPP
650.0000 mg | Freq: Once | RECTAL | Status: AC
  Administered 2017-06-13: 650 mg via RECTAL

## 2017-06-13 MED ORDER — SODIUM CHLORIDE 0.9 % IV SOLN
INTRAVENOUS | Status: DC
  Administered 2017-06-13: 1.3 [IU]/h via INTRAVENOUS
  Filled 2017-06-13: qty 1

## 2017-06-13 MED ORDER — PROTAMINE SULFATE 10 MG/ML IV SOLN
INTRAVENOUS | Status: DC | PRN
  Administered 2017-06-13: 290 mg via INTRAVENOUS
  Administered 2017-06-13: 10 mg via INTRAVENOUS

## 2017-06-13 MED ORDER — MILRINONE LACTATE IN DEXTROSE 20-5 MG/100ML-% IV SOLN
0.0000 ug/kg/min | INTRAVENOUS | Status: DC
  Administered 2017-06-13: 0.3 ug/kg/min via INTRAVENOUS
  Filled 2017-06-13: qty 100

## 2017-06-13 MED ORDER — SODIUM CHLORIDE 0.9% FLUSH: 10.0000 mL | INTRAVENOUS | Status: DC | PRN

## 2017-06-13 MED ORDER — MORPHINE SULFATE (PF) 4 MG/ML IV SOLN: 1.0000 mg | INTRAVENOUS | Status: AC | PRN

## 2017-06-13 MED ORDER — MILRINONE LACTATE IN DEXTROSE 20-5 MG/100ML-% IV SOLN
0.1250 ug/kg/min | INTRAVENOUS | Status: AC
  Administered 2017-06-13: .3 ug/kg/min via INTRAVENOUS
  Filled 2017-06-13: qty 100

## 2017-06-13 MED ORDER — SODIUM CHLORIDE 0.9% FLUSH
10.0000 mL | Freq: Two times a day (BID) | INTRAVENOUS | Status: DC
  Administered 2017-06-13 – 2017-06-15 (×5): 10 mL

## 2017-06-13 MED ORDER — VANCOMYCIN HCL IN DEXTROSE 1-5 GM/200ML-% IV SOLN
1000.0000 mg | Freq: Once | INTRAVENOUS | Status: AC
  Administered 2017-06-13: 1000 mg via INTRAVENOUS
  Filled 2017-06-13: qty 200

## 2017-06-13 MED ORDER — BISACODYL 10 MG RE SUPP: 10.0000 mg | Freq: Every day | RECTAL | Status: DC

## 2017-06-13 MED ORDER — SODIUM CHLORIDE 0.9% FLUSH
3.0000 mL | Freq: Two times a day (BID) | INTRAVENOUS | Status: DC
  Administered 2017-06-14 – 2017-06-17 (×4): 3 mL via INTRAVENOUS

## 2017-06-13 MED ORDER — METOPROLOL TARTRATE 25 MG/10 ML ORAL SUSPENSION: 12.5000 mg | Freq: Two times a day (BID) | ORAL | Status: DC

## 2017-06-13 MED ORDER — ALBUMIN HUMAN 5 % IV SOLN
250.0000 mL | INTRAVENOUS | Status: AC | PRN
  Administered 2017-06-13 (×2): 250 mL via INTRAVENOUS

## 2017-06-13 MED ORDER — METOPROLOL TARTRATE 5 MG/5ML IV SOLN
2.5000 mg | INTRAVENOUS | Status: DC | PRN
  Administered 2017-06-15: 5 mg via INTRAVENOUS
  Filled 2017-06-13: qty 5

## 2017-06-13 MED ORDER — INSULIN REGULAR BOLUS VIA INFUSION: 0.0000 [IU] | Freq: Three times a day (TID) | INTRAVENOUS | Status: DC

## 2017-06-13 MED ORDER — MIDAZOLAM HCL 5 MG/5ML IJ SOLN
INTRAMUSCULAR | Status: DC | PRN
  Administered 2017-06-13 (×3): 2 mg via INTRAVENOUS
  Administered 2017-06-13: 3 mg via INTRAVENOUS
  Administered 2017-06-13: 1 mg via INTRAVENOUS

## 2017-06-13 MED ORDER — OXYCODONE HCL 5 MG PO TABS
5.0000 mg | ORAL_TABLET | ORAL | Status: DC | PRN
  Administered 2017-06-13: 5 mg via ORAL
  Administered 2017-06-14 (×2): 10 mg via ORAL
  Administered 2017-06-14 (×2): 5 mg via ORAL
  Administered 2017-06-15 – 2017-06-16 (×3): 10 mg via ORAL
  Filled 2017-06-13 (×4): qty 2
  Filled 2017-06-13: qty 1
  Filled 2017-06-13: qty 2
  Filled 2017-06-13 (×2): qty 1

## 2017-06-13 MED FILL — Electrolyte-R (PH 7.4) Solution: INTRAVENOUS | Qty: 4000 | Status: AC

## 2017-06-13 MED FILL — Sodium Chloride IV Soln 0.9%: INTRAVENOUS | Qty: 1000 | Status: AC

## 2017-06-13 MED FILL — Mannitol IV Soln 20%: INTRAVENOUS | Qty: 1000 | Status: AC

## 2017-06-13 MED FILL — Sodium Bicarbonate IV Soln 8.4%: INTRAVENOUS | Qty: 50 | Status: AC

## 2017-06-13 MED FILL — Lidocaine HCl IV Inj 20 MG/ML: INTRAVENOUS | Qty: 25 | Status: AC

## 2017-06-13 MED FILL — Heparin Sodium (Porcine) Inj 1000 Unit/ML: INTRAMUSCULAR | Qty: 10 | Status: AC

## 2017-06-13 SURGICAL SUPPLY — 130 items
ADAPTER CARDIO PERF ANTE/RETRO (ADAPTER) ×3 IMPLANT
ADPR PRFSN 84XANTGRD RTRGD (ADAPTER) ×2
BAG DECANTER FOR FLEXI CONT (MISCELLANEOUS) ×6 IMPLANT
BANDAGE ACE 4X5 VEL STRL LF (GAUZE/BANDAGES/DRESSINGS) ×3 IMPLANT
BANDAGE ACE 6X5 VEL STRL LF (GAUZE/BANDAGES/DRESSINGS) ×3 IMPLANT
BANDAGE ELASTIC 4 VELCRO ST LF (GAUZE/BANDAGES/DRESSINGS) ×1 IMPLANT
BANDAGE ELASTIC 6 VELCRO ST LF (GAUZE/BANDAGES/DRESSINGS) ×1 IMPLANT
BASKET HEART (ORDER IN 25'S) (MISCELLANEOUS) ×1
BASKET HEART (ORDER IN 25S) (MISCELLANEOUS) ×2 IMPLANT
BLADE CLIPPER SURG (BLADE) IMPLANT
BLADE STERNUM SYSTEM 6 (BLADE) ×3 IMPLANT
BLADE SURG 11 STRL SS (BLADE) ×4 IMPLANT
BNDG GAUZE ELAST 4 BULKY (GAUZE/BANDAGES/DRESSINGS) ×3 IMPLANT
CANISTER SUCT 3000ML PPV (MISCELLANEOUS) ×3 IMPLANT
CANNULA EZ GLIDE AORTIC 21FR (CANNULA) ×6 IMPLANT
CANNULA GUNDRY RCSP 15FR (MISCELLANEOUS) ×3 IMPLANT
CANNULA SOFTFLOW AORTIC 7M21FR (CANNULA) ×3 IMPLANT
CATH CPB KIT OWEN (MISCELLANEOUS) ×3 IMPLANT
CATH HEART VENT LEFT (CATHETERS) ×2 IMPLANT
CATH THORACIC 36FR (CATHETERS) ×3 IMPLANT
CATH THORACIC 36FR RT ANG (CATHETERS) ×3 IMPLANT
CLIP VESOCCLUDE MED 24/CT (Clip) IMPLANT
CLIP VESOCCLUDE SM WIDE 24/CT (Clip) IMPLANT
COVER SURGICAL LIGHT HANDLE (MISCELLANEOUS) ×3 IMPLANT
CRADLE DONUT ADULT HEAD (MISCELLANEOUS) ×3 IMPLANT
DRAIN CHANNEL 32F RND 10.7 FF (WOUND CARE) ×6 IMPLANT
DRAPE BILATERAL SPLIT (DRAPES) IMPLANT
DRAPE CARDIOVASCULAR INCISE (DRAPES) ×3
DRAPE CV SPLIT W-CLR ANES SCRN (DRAPES) IMPLANT
DRAPE INCISE IOBAN 66X45 STRL (DRAPES) ×6 IMPLANT
DRAPE INCISE IOBAN 85X60 (DRAPES) ×2 IMPLANT
DRAPE SLUSH/WARMER DISC (DRAPES) ×3 IMPLANT
DRAPE SRG 135X102X78XABS (DRAPES) ×2 IMPLANT
DRSG COVADERM 4X14 (GAUZE/BANDAGES/DRESSINGS) ×3 IMPLANT
ELECT BLADE 4.0 EZ CLEAN MEGAD (MISCELLANEOUS) ×3
ELECT REM PT RETURN 9FT ADLT (ELECTROSURGICAL) ×6
ELECTRODE BLDE 4.0 EZ CLN MEGD (MISCELLANEOUS) ×2 IMPLANT
ELECTRODE REM PT RTRN 9FT ADLT (ELECTROSURGICAL) ×4 IMPLANT
FELT TEFLON 1X6 (MISCELLANEOUS) ×6 IMPLANT
GAUZE SPONGE 4X4 12PLY STRL (GAUZE/BANDAGES/DRESSINGS) ×6 IMPLANT
GAUZE SPONGE 4X4 12PLY STRL LF (GAUZE/BANDAGES/DRESSINGS) ×2 IMPLANT
GLOVE BIO SURGEON STRL SZ 6 (GLOVE) IMPLANT
GLOVE BIO SURGEON STRL SZ 6.5 (GLOVE) IMPLANT
GLOVE BIO SURGEON STRL SZ7 (GLOVE) IMPLANT
GLOVE BIO SURGEON STRL SZ7.5 (GLOVE) IMPLANT
GLOVE ORTHO TXT STRL SZ7.5 (GLOVE) ×9 IMPLANT
GOWN STRL REUS W/ TWL LRG LVL3 (GOWN DISPOSABLE) ×8 IMPLANT
GOWN STRL REUS W/TWL LRG LVL3 (GOWN DISPOSABLE) ×12
HEMOSTAT POWDER SURGIFOAM 1G (HEMOSTASIS) ×9 IMPLANT
INSERT FOGARTY XLG (MISCELLANEOUS) ×3 IMPLANT
KIT BASIN OR (CUSTOM PROCEDURE TRAY) ×3 IMPLANT
KIT DRAINAGE VACCUM ASSIST (KITS) ×1 IMPLANT
KIT ROOM TURNOVER OR (KITS) ×3 IMPLANT
KIT SUCTION CATH 14FR (SUCTIONS) ×9 IMPLANT
KIT SUT CK MINI COMBO 4X17 (Prosthesis & Implant Heart) ×1 IMPLANT
KIT VASOVIEW HEMOPRO VH 3000 (KITS) ×3 IMPLANT
LEAD PACING MYOCARDI (MISCELLANEOUS) ×3 IMPLANT
LINE VENT (MISCELLANEOUS) ×1 IMPLANT
MARKER GRAFT CORONARY BYPASS (MISCELLANEOUS) ×9 IMPLANT
NS IRRIG 1000ML POUR BTL (IV SOLUTION) ×15 IMPLANT
PACK OPEN HEART (CUSTOM PROCEDURE TRAY) ×3 IMPLANT
PAD ARMBOARD 7.5X6 YLW CONV (MISCELLANEOUS) ×6 IMPLANT
PAD ELECT DEFIB RADIOL ZOLL (MISCELLANEOUS) ×3 IMPLANT
PENCIL BUTTON HOLSTER BLD 10FT (ELECTRODE) ×3 IMPLANT
PUNCH AORTIC ROTATE 4.0MM (MISCELLANEOUS) ×1 IMPLANT
PUNCH AORTIC ROTATE 4.5MM 8IN (MISCELLANEOUS) IMPLANT
PUNCH AORTIC ROTATE 5MM 8IN (MISCELLANEOUS) IMPLANT
SET CARDIOPLEGIA MPS 5001102 (MISCELLANEOUS) ×1 IMPLANT
SET IRRIG TUBING LAPAROSCOPIC (IRRIGATION / IRRIGATOR) ×3 IMPLANT
SOLUTION ANTI FOG 6CC (MISCELLANEOUS) ×1 IMPLANT
SPONGE LAP 18X18 X RAY DECT (DISPOSABLE) IMPLANT
SPONGE LAP 4X18 X RAY DECT (DISPOSABLE) IMPLANT
SUT BONE WAX W31G (SUTURE) ×3 IMPLANT
SUT ETHIBON 2 0 V 52N 30 (SUTURE) ×6 IMPLANT
SUT ETHIBON EXCEL 2-0 V-5 (SUTURE) IMPLANT
SUT ETHIBOND 2 0 SH (SUTURE)
SUT ETHIBOND 2 0 SH 36X2 (SUTURE) IMPLANT
SUT ETHIBOND 2 0 V4 (SUTURE) IMPLANT
SUT ETHIBOND 2 0V4 GREEN (SUTURE) IMPLANT
SUT ETHIBOND 4 0 RB 1 (SUTURE) IMPLANT
SUT ETHIBOND V-5 VALVE (SUTURE) IMPLANT
SUT ETHIBOND X763 2 0 SH 1 (SUTURE) ×13 IMPLANT
SUT MNCRL AB 3-0 PS2 18 (SUTURE) ×6 IMPLANT
SUT MNCRL AB 4-0 PS2 18 (SUTURE) IMPLANT
SUT PDS AB 1 CTX 36 (SUTURE) ×6 IMPLANT
SUT PROLENE 2 0 SH DA (SUTURE) IMPLANT
SUT PROLENE 3 0 SH DA (SUTURE) ×3 IMPLANT
SUT PROLENE 3 0 SH1 36 (SUTURE) ×3 IMPLANT
SUT PROLENE 4 0 RB 1 (SUTURE) ×6
SUT PROLENE 4 0 SH DA (SUTURE) ×4 IMPLANT
SUT PROLENE 4-0 RB1 .5 CRCL 36 (SUTURE) ×4 IMPLANT
SUT PROLENE 5 0 C 1 36 (SUTURE) ×2 IMPLANT
SUT PROLENE 6 0 C 1 30 (SUTURE) ×2 IMPLANT
SUT PROLENE 7.0 RB 3 (SUTURE) ×8 IMPLANT
SUT PROLENE 8 0 BV175 6 (SUTURE) IMPLANT
SUT PROLENE BLUE 7 0 (SUTURE) ×3 IMPLANT
SUT PROLENE POLY MONO (SUTURE) IMPLANT
SUT SILK  1 MH (SUTURE) ×3
SUT SILK 1 MH (SUTURE) ×2 IMPLANT
SUT SILK 2 0 SH CR/8 (SUTURE) ×2 IMPLANT
SUT SILK 2 0 TIES 10X30 (SUTURE) ×2 IMPLANT
SUT SILK 3 0 SH CR/8 (SUTURE) ×1 IMPLANT
SUT SILK 4 0 TIE 10X30 (SUTURE) ×2 IMPLANT
SUT STEEL 6MS V (SUTURE) IMPLANT
SUT STEEL STERNAL CCS#1 18IN (SUTURE) IMPLANT
SUT STEEL SZ 6 DBL 3X14 BALL (SUTURE) ×2 IMPLANT
SUT TEM PAC WIRE 2 0 SH (SUTURE) ×2 IMPLANT
SUT VIC AB 1 CTX 36 (SUTURE) ×6
SUT VIC AB 1 CTX36XBRD ANBCTR (SUTURE) IMPLANT
SUT VIC AB 2-0 CT1 27 (SUTURE) ×6
SUT VIC AB 2-0 CT1 TAPERPNT 27 (SUTURE) IMPLANT
SUT VIC AB 2-0 CTX 27 (SUTURE) IMPLANT
SUT VIC AB 3-0 SH 27 (SUTURE)
SUT VIC AB 3-0 SH 27X BRD (SUTURE) IMPLANT
SUT VIC AB 3-0 X1 27 (SUTURE) ×1 IMPLANT
SUT VICRYL 4-0 PS2 18IN ABS (SUTURE) IMPLANT
SUTURE E-PAK OPEN HEART (SUTURE) ×3 IMPLANT
SYSTEM SAHARA CHEST DRAIN ATS (WOUND CARE) ×3 IMPLANT
TAPE CLOTH SURG 6X10 WHT LF (GAUZE/BANDAGES/DRESSINGS) ×1 IMPLANT
TAPE PAPER 2X10 WHT MICROPORE (GAUZE/BANDAGES/DRESSINGS) ×1 IMPLANT
TOWEL GREEN STERILE (TOWEL DISPOSABLE) ×12 IMPLANT
TOWEL GREEN STERILE FF (TOWEL DISPOSABLE) ×6 IMPLANT
TOWEL OR 17X24 6PK STRL BLUE (TOWEL DISPOSABLE) ×6 IMPLANT
TOWEL OR 17X26 10 PK STRL BLUE (TOWEL DISPOSABLE) ×6 IMPLANT
TRAY FOLEY SILVER 16FR TEMP (SET/KITS/TRAYS/PACK) ×3 IMPLANT
TUBING INSUFFLATION (TUBING) ×3 IMPLANT
UNDERPAD 30X30 (UNDERPADS AND DIAPERS) ×3 IMPLANT
VALVE SYSTEM EDWS INTUITY 23A (Prosthesis & Implant Heart) ×1 IMPLANT
VENT LEFT HEART 12002 (CATHETERS) ×3
WATER STERILE IRR 1000ML POUR (IV SOLUTION) ×6 IMPLANT

## 2017-06-13 NOTE — Op Note (Signed)
CARDIOTHORACIC SURGERY OPERATIVE NOTE  Date of Procedure:  06/13/2017  Preoperative Diagnosis:   Severe Aortic Stenosis  Severe Single-vessel Coronary Artery Disease  Postoperative Diagnosis: Same  Procedure:    Aortic Valve Replacement  Edwards Intuity Elite Rapid Deployment Bovine Pericardial Tissue Valve (size 18m, model # 80350KX serial # [id number]   Coronary Artery Bypass Grafting x 1  Reversed Greater Saphenous Vein Graft to Posterior Descending Coronary Artery  Endoscopic Vein Harvest from Right Thigh   Surgeon: CValentina Gu ORoxy Manns MD  Assistant: WJohn Giovanni PA-C  Anesthesia: WArabella Merles MD  Operative Findings:  Bicuspid aortic valve with severe aortic stenosis (Sievers type I)  Severe global LV systolic dysfunction, EF 238% Moderate pulmonary hypertension          BRIEF CLINICAL NOTE AND INDICATIONS FOR SURGERY  Patient is a 61year old male with history of aortic stenosis, coronary artery disease status post myocardial infarction in 2003 treated with PCI and stenting, and chronic systolic congestive heart failure who has been referred for surgical consultation to discuss treatment options for management of severe symptomatic aortic stenosis and coronary artery disease. The patient states that he developed acute shortness of breath in 2003 and was diagnosed with an acute myocardial infarction. He was treated with PCI and stenting involving "the [facility] on the backside [facility] Details of that procedure are not available. The patient recovered quickly and has not had any further cardiac problems until recently. Beginning last December the patient began to experience progressive symptoms of exertional shortness of breath, orthopnea, and severe lower extremity edema as well as abdominal swelling with scrotal edema. He eventually reestablish care with Dr. DQuillian Quincewho noted the presence of a systolic murmur on physical  exam,diagnosed him with acute on chronic congestive heart failure and started the patient on Lasix. An echocardiogram was performed in early May that reportedly demonstrated the presence of moderate to severe left ventricular systolic dysfunction with ejection fraction estimated 30-35%, bicuspid aortic valve with severe aortic stenosis, mild mitral regurgitation, and mild pulmonary hypertension. The patient's symptoms improved considerably and he lost more than 50 pounds in weight. He was referred for cardiology consultation and evaluated by Dr. KBronson Ingon 05/02/2017. Repeat echocardiogram performed 05/09/2017 confirmed the presence of severe left ventricular systolic dysfunction and severe aortic stenosis. Left ventricular ejection fraction was estimated 25-30%. Peak velocity across the aortic valve measured greater than 3.936m corresponding to mean transvalvular gradient estimated 41 mmHg. There was mild mitral regurgitation and moderate pulmonary hypertension. Diagnostic cardiac catheterization was performed 05/09/2017 by Dr. CoBurt KnackThe patient was noted to have severe single-vessel coronary artery disease involving the right coronary artery, mild nonobstructive disease in the left anterior descending coronary artery and left circumflex coronary artery, and severe aortic stenosis with mean transvalvular gradient measured 37 mmHg corresponding to aortic valve area calculated 0.68 cm. There was severe global left ventricular systolic dysfunction with ejection fraction estimated less than 30%. Cardiac gated CT angiogram of the heart was performed and cardiothoracic surgical consultation was requested. The patient has been seen in consultation and counseled at length regarding the indications, risks and potential benefits of surgery.  All questions have been answered, and the patient provides full informed consent for the operation as described.    DETAILS OF THE OPERATIVE  PROCEDURE  Preparation:  The patient is brought to the operating room on the above mentioned date and central monitoring was established by the anesthesia team including placement of Swan-Ganz  catheter and radial arterial line. There was moderate pulmonary hypertension at baseline. The patient is placed in the supine position on the operating table.  Intravenous antibiotics are administered. General endotracheal anesthesia is induced uneventfully. A Foley catheter is placed.  Baseline transesophageal echocardiogram was performed.  Findings were notable for severe aortic stenosis with severe global left ventricular systolic dysfunction. The aortic valve was severely calcified and thickened with extremely restricted leaflet mobility. The left ventricle was dilated. Ejection fraction was estimated 25%. There was mild central mitral regurgitation. Right ventricle was dilated. There was mild right ventricular systolic dysfunction.  The patient's chest, abdomen, both groins, and both lower extremities are prepared and draped in a sterile manner. A time out procedure is performed.   Surgical Approach and Conduit Harvest:  The greater saphenous vein is obtained from the patient's right thigh using endoscopic vein harvest technique. The saphenous vein is notably good quality conduit. After removal of the saphenous vein, the small surgical incisions in the lower extremity are closed with absorbable suture.  A median sternotomy incision was performed and the pericardium is opened. The ascending aorta is normal in appearance.    Extracorporeal Cardiopulmonary Bypass and Myocardial Protection:  The ascending aorta and the right atrium are cannulated for cardioplegia bypass.  Adequate heparinization is verified.   A retrograde cardioplegia cannula is placed through the right atrium into the coronary sinus.  The operative field was continuously flooded with carbon dioxide gas.  The entire pre-bypass portion of  the operation was notable for stable hemodynamics.  Cardiopulmonary bypass was begun and a left ventricular vent placed through the right superior pulmonary vein.  The surface of the heart inspected. Distal target vessels are selected for coronary artery bypass grafting. A cardioplegia cannula is placed in the ascending aorta.  A temperature probe was placed in the interventricular septum.  The patient is cooled to 32C systemic temperature.  The aortic cross clamp is applied and cardioplegia is delivered initially in an antegrade fashion through the aortic root using modified [place] cold blood cardioplegia (KBC protocol).   The initial cardioplegic arrest is rapid with early diastolic arrest.    Myocardial protection was felt to be excellent.   Coronary Artery Bypass Grafting:  The posterior descending branch of the right coronary artery was grafted using a reversed saphenous vein graft in an end-to-side fashion.  At the site of distal anastomosis the target vessel was fair to good quality and measured approximately 1.5 mm in diameter.   Aortic Valve Replacement:  An oblique transverse aortotomy incision was performed.  The aortic valve was inspected and notable for bicuspid aortic valve with a single raphe due to fusion of the left and right coronary cusps.  There was severe aortic stenosis.  The aortic valve leaflets were excised sharply and the aortic annulus decalcified.  Decalcification was notably straightforward.  The aortic annulus was sized to accept a 23 mm prosthesis.  The aortic root and left ventricle were irrigated with copious cold saline solution.  Aortic valve replacement was performed using an [employer] rapid deployment pericardial tissue valve (size 23 mm, model #8300AB, serial # [id number]).  The valve was rinsed in saline for manufacture recommendations. A total of 5 individual guiding sutures are placed through the aortic annulus, each at the corresponding nadir of  each sinus of Valsalva.  The valve was attached to the delivery device and the 3 guiding sutures placed through the valve sewing cuff. The valve is lowered into place.  Care is taken to insure that the valve is completely seated in the annulus. Each of the guide sutures are secured using Cor knot clips.  The valve stent is deployed by inflating the deployment balloon to 4.5 atm pressure and holding for 10 seconds. The balloon is deflated the valve holder sutures cut In the deployment system removed.   The valve is carefully inspected to make sure that it is seated appropriately. Endoscopic visualization through the valve is utilized to inspect the left ventricular outflow tract. Aortic root was filled with saline to make sure the valve is competent. Rewarming is begun.   Procedure Completion:  The single proximal vein graft anastomosis was placed directly to the ascending aorta prior to removal of the aortic cross clamp.  The septal myocardial temperature rose rapidly after reperfusion of the left internal mammary artery graft.  One final dose of warm retrograde "reanimation dose" cardioplegia was administered through the coronary sinus catheter while all air was evacuated through the aortic root.  The aortic cross clamp was removed after a total cross clamp time of 76 minutes.  All proximal and distal coronary anastomoses were inspected for hemostasis and appropriate graft orientation. Epicardial pacing wires are fixed to the right ventricular outflow tract and to the right atrial appendage. The patient is rewarmed to 37C temperature. The aortic and left ventricular vents were removed.  The patient is weaned and disconnected from cardiopulmonary bypass.  The patient's rhythm at separation from bypass was AV paced.  The patient was weaned from cardiopulmonary bypass on low dose milrinone infusion. Total cardiopulmonary bypass time for the operation was 104 minutes.  Followup transesophageal echocardiogram  performed after separation from bypass revealed a well-seated bioprosthetic tissue valve in the aortic position that was functioning normally.  There was no perivalvular leak.  Deep transgastric views are obtained from multiple angles to make certain the valve is well-seated.  Mean transvalvular gradient across the valve is estimated 6 mmHg.  There were otherwise no changes from the preoperative exam.  The aortic and venous cannula were removed uneventfully. Protamine was administered to reverse the anticoagulation. The mediastinum and pleural space were inspected for hemostasis and irrigated with saline solution. The mediastinum and both pleural spaces were drained using 4 chest tubes placed through separate stab incisions inferiorly.  The soft tissues anterior to the aorta were reapproximated loosely. The sternum is closed with double strength sternal wire. The soft tissues anterior to the sternum were closed in multiple layers and the skin is closed with a running subcuticular skin closure.  The post-bypass portion of the operation was notable for stable rhythm and hemodynamics.  No blood products were administered during the operation.   Disposition:  The patient tolerated the procedure well and is transported to the surgical intensive care in stable condition. There are no intraoperative complications. All sponge instrument and needle counts are verified correct at completion of the operation.   Valentina Gu. Roxy Manns MD 06/13/2017 12:54 PM

## 2017-06-13 NOTE — Transfer of Care (Signed)
Immediate Anesthesia Transfer of Care Note  Patient: Pablo LawrenceDoyle J Cowley  Procedure(s) Performed: Procedure(s): AORTIC VALVE REPLACEMENT (AVR) (N/A) CORONARY ARTERY BYPASS GRAFTING (CABG) times one using SVG to Posterior Descending Artery (N/A) TRANSESOPHAGEAL ECHOCARDIOGRAM (TEE) (N/A)  Patient Location: SICU  Anesthesia Type:General  Level of Consciousness: Patient remains intubated per anesthesia plan  Airway & Oxygen Therapy: Patient remains intubated per anesthesia plan  Post-op Assessment: Report given to RN and Post -op Vital signs reviewed and stable  Post vital signs: Reviewed and stable  Last Vitals:  Vitals:   06/13/17 0640 06/13/17 0700  BP: 109/72   Pulse: 94   Resp: 20   Temp: (!) 36.3 C 37.1 C    Last Pain:  Vitals:   06/13/17 0700  TempSrc: Oral         Complications: No apparent anesthesia complications

## 2017-06-13 NOTE — Procedures (Signed)
Extubation Procedure Note  Patient Details:   Name: Eugene Frazier DOB: [birth year] MRN: [id number]   Airway Documentation:     Evaluation  O2 sats: stable throughout Complications: No apparent complications Patient did tolerate procedure well. Bilateral Breath Sounds: Clear, Diminished   Yes   Pt extubated to 4L N/C.  No stridor noted.  RN @ bedside.  Christophe LouisSteven D Mathews Stuhr 06/13/2017, 6:50 PM

## 2017-06-13 NOTE — Anesthesia Postprocedure Evaluation (Signed)
Anesthesia Post Note  Patient: Pablo LawrenceDoyle J Lanigan  Procedure(s) Performed: Procedure(s) (LRB): AORTIC VALVE REPLACEMENT (AVR) (N/A) CORONARY ARTERY BYPASS GRAFTING (CABG) times one using SVG to Posterior Descending Artery (N/A) TRANSESOPHAGEAL ECHOCARDIOGRAM (TEE) (N/A)     Patient location during evaluation: SICU Anesthesia Type: General Level of consciousness: sedated Pain management: pain level controlled Vital Signs Assessment: post-procedure vital signs reviewed and stable Respiratory status: patient remains intubated per anesthesia plan Cardiovascular status: stable Anesthetic complications: no    Last Vitals:  Vitals:   06/13/17 1615 06/13/17 1700  BP:  111/60  Pulse: 89 89  Resp: (!) 21 (!) 22  Temp: 37 C 37.2 C    Last Pain:  Vitals:   06/13/17 0700  TempSrc: Oral                 Henli Hey,W. EDMOND

## 2017-06-13 NOTE — Brief Op Note (Addendum)
06/13/2017  11:46 AM  PATIENT:  Eugene Frazier  61 y.o. male  PRE-OPERATIVE DIAGNOSIS:  aortic stenosis, coronary artery disease  POST-OPERATIVE DIAGNOSIS: same PROCEDURE:  Procedure(s): AORTIC VALVE REPLACEMENT (AVR) (N/A) CORONARY ARTERY BYPASS GRAFTING (CABG) (N/A) TRANSESOPHAGEAL ECHOCARDIOGRAM (TEE) (N/A) SVG-PD #23 INTUITY BIOPROSTHETIC RAPID DEPLOYMENT AVR ENDOSCOPIC GREATER SAPHENOUS VEIN HARVEST(EVH)( RIGHT THIGH)  SURGEON:    [name], MD  ASSISTANTS:  Rowe ClackWayne E Gold, PA-C  ANESTHESIA:   Gaynelle AduFitzgerald, William, MD  CROSSCLAMP TIME:   4076'  CARDIOPULMONARY BYPASS TIME: 104'  FINDINGS:  Bicuspid aortic valve with severe aortic stenosis (Sievers type I)  Severe global LV systolic dysfunction, EF 25%  Moderate pulmonary hypertension  COMPLICATIONS: None  BASELINE WEIGHT: 86 kg  PATIENT DISPOSITION:   TO SICU IN STABLE CONDITION  [name], MD 06/13/2017 12:51 PM

## 2017-06-13 NOTE — Anesthesia Preprocedure Evaluation (Addendum)
Anesthesia Evaluation  Patient identified by MRN, date of birth, ID band Patient awake    Reviewed: Allergy & Precautions, H&P , NPO status , Patient's Chart, lab work & pertinent test results, reviewed documented beta blocker date and time   Airway Mallampati: II  TM Distance: >3 FB Neck ROM: Full    Dental no notable dental hx. (+) Partial Lower, Partial Upper, Dental Advisory Given   Pulmonary neg pulmonary ROS, former smoker,    Pulmonary exam normal breath sounds clear to auscultation       Cardiovascular hypertension, Pt. on medications and Pt. on home beta blockers + CAD and +CHF  + Valvular Problems/Murmurs AS  Rhythm:Regular Rate:Normal     Neuro/Psych negative neurological ROS  negative psych ROS   GI/Hepatic negative GI ROS, Neg liver ROS,   Endo/Other  negative endocrine ROS  Renal/GU negative Renal ROS  negative genitourinary   Musculoskeletal  (+) Arthritis , Osteoarthritis,    Abdominal   Peds  Hematology negative hematology ROS (+)   Anesthesia Other Findings   Reproductive/Obstetrics negative OB ROS                            Anesthesia Physical Anesthesia Plan  ASA: IV  Anesthesia Plan: General   Post-op Pain Management:    Induction: Intravenous  PONV Risk Score and Plan: Treatment may vary due to age or medical condition and Midazolam  Airway Management Planned: Oral ETT  Additional Equipment: Arterial line, CVP, PA Cath, TEE, 3D TEE and Ultrasound Guidance Line Placement  Intra-op Plan:   Post-operative Plan: Post-operative intubation/ventilation  Informed Consent: I have reviewed the patients History and Physical, chart, labs and discussed the procedure including the risks, benefits and alternatives for the proposed anesthesia with the patient or authorized representative who has indicated his/her understanding and acceptance.   Dental advisory  given  Plan Discussed with: CRNA  Anesthesia Plan Comments:        Anesthesia Quick Evaluation

## 2017-06-13 NOTE — Interval H&P Note (Signed)
History and Physical Interval Note:  06/13/2017 7:42 AM  Eugene Frazier J [place]  has presented today for surgery, with the diagnosis of aortic stenosis, coronary artery disease  The various methods of treatment have been discussed with the patient and family. After consideration of risks, benefits and other options for treatment, the patient has consented to  Procedure(s): AORTIC VALVE REPLACEMENT (AVR) (N/A) CORONARY ARTERY BYPASS GRAFTING (CABG) (N/A) TRANSESOPHAGEAL ECHOCARDIOGRAM (TEE) (N/A) as a surgical intervention .  The patient's history has been reviewed, patient examined, no change in status, stable for surgery.  I have reviewed the patient's chart and labs.  Questions were answered to the patient's satisfaction.     [name]

## 2017-06-13 NOTE — Anesthesia Procedure Notes (Signed)
Central Venous Catheter Insertion Performed by: Roderic Palau, anesthesiologist Start/End7/10/2017 7:55 AM, 06/13/2017 8:05 AM Patient location: Pre-op. Preanesthetic checklist: patient identified, IV checked, site marked, risks and benefits discussed, surgical consent, monitors and equipment checked, pre-op evaluation, timeout performed and anesthesia consent Position: Trendelenburg Lidocaine 1% used for infiltration and patient sedated Hand hygiene performed , maximum sterile barriers used  and Seldinger technique used Catheter size: 9 Fr Total catheter length 10. Central line and PA cath was placed.MAC introducer Swan type:thermodilution PA Cath depth:50 Procedure performed using ultrasound guided technique. Ultrasound Notes:anatomy identified, needle tip was noted to be adjacent to the nerve/plexus identified, no ultrasound evidence of intravascular and/or intraneural injection and image(s) printed for medical record Attempts: 1 Following insertion, line sutured and dressing applied. Post procedure assessment: blood return through all ports, free fluid flow and no air  Patient tolerated the procedure well with no immediate complications.

## 2017-06-13 NOTE — Anesthesia Procedure Notes (Signed)
Arterial Line Insertion Start/End7/10/2017 8:15 AM, 06/13/2017 8:15 AM Performed by: Marena ChancyBECKNER, ZACHARY S, CRNA  Preanesthetic checklist: patient identified, IV checked, site marked, risks and benefits discussed, surgical consent, monitors and equipment checked, pre-op evaluation, timeout performed and anesthesia consent Left, radial was placed Catheter size: 20 G Hand hygiene performed  and maximum sterile barriers used  Allen's test indicative of satisfactory collateral circulation Attempts: 2 Procedure performed without using ultrasound guided technique. Following insertion, dressing applied and Biopatch. Post procedure assessment: normal  Patient tolerated the procedure well with no immediate complications.

## 2017-06-13 NOTE — Anesthesia Procedure Notes (Signed)
Procedure Name: Intubation Date/Time: 06/13/2017 8:46 AM Performed by: Clearnce Sorrel Pre-anesthesia Checklist: Patient identified, Emergency Drugs available, Suction available, Patient being monitored and Timeout performed Patient Re-evaluated:Patient Re-evaluated prior to inductionOxygen Delivery Method: Circle system utilized Preoxygenation: Pre-oxygenation with 100% oxygen Intubation Type: IV induction Ventilation: Mask ventilation without difficulty Laryngoscope Size: Mac and 4 Grade View: Grade II Tube type: Subglottic suction tube Tube size: 8.0 mm Number of attempts: 1 Airway Equipment and Method: Stylet Placement Confirmation: ETT inserted through vocal cords under direct vision,  positive ETCO2 and breath sounds checked- equal and bilateral Secured at: 23 cm Tube secured with: Tape Dental Injury: Teeth and Oropharynx as per pre-operative assessment

## 2017-06-13 NOTE — Care Management Note (Addendum)
Case Management Note  Patient Details  Name: Eugene Frazier  MRN: [id number] Date of Birth: July 04, 1956  Subjective/Objective:   From home alone, post op aortic valve replacement, CABG .  He states he has a friend , Consuella Loselaine that lives in [place] lives, and they are close friends, they go out and have dinner together and he will be staying with her after discharge until he gets better.                   Action/Plan: NCM will cont to follow for dc needs.   Expected Discharge Date:                  Expected Discharge Plan:     In-House Referral:     Discharge planning Services  CM Consult  Post Acute Care Choice:    Choice offered to:     DME Arranged:    DME Agency:     HH Arranged:    HH Agency:     Status of Service:  In process, will continue to follow  If discussed at Long Length of Stay Meetings, dates discussed:    Additional Comments:  Leone Havenaylor, Juliah Scadden Clinton, RN 06/13/2017, 4:33 PM

## 2017-06-13 NOTE — Progress Notes (Signed)
Patient ID: [name], male   DOB: 1956-03-31, 61 y.o.   MRN: [id number] EVENING ROUNDS NOTE :     [address]       [place] [id number]             [phone]                 Day of Surgery Procedure(s) (LRB): AORTIC VALVE REPLACEMENT (AVR) (N/A) CORONARY ARTERY BYPASS GRAFTING (CABG) times one using SVG to Posterior Descending Artery (N/A) TRANSESOPHAGEAL ECHOCARDIOGRAM (TEE) (N/A)  Total Length of Stay:  LOS: 0 days  BP 108/60   Pulse 89   Temp 99.3 F (37.4 C)   Resp (!) 26   SpO2 99%   .Intake/Output      07/11 0701 - 07/12 0700   I.V. 3011.7   Blood 767   Total Intake 3778.7   Urine 1250   Blood 1100   Chest Tube 375   Total Output 2725   Net +1053.7         . sodium chloride 20 mL/hr at 06/13/17 1330  . sodium chloride 100 mL/hr at 06/13/17 1330  . [START ON 06/14/2017] sodium chloride    . sodium chloride 20 mL/hr at 06/13/17 1330  . albumin human    . dexmedetomidine (PRECEDEX) IV infusion 0.1 mcg/kg/hr (06/13/17 1737)  . famotidine (PEPCID) IV Stopped (06/13/17 1442)  . insulin (NOVOLIN-R) infusion 1.3 Units/hr (06/13/17 1641)  . lactated ringers    . lactated ringers 10 mL/hr at 06/13/17 1330  . lactated ringers 10 mL/hr at 06/13/17 1330  . [START ON 06/14/2017] levofloxacin (LEVAQUIN) IV    . milrinone 0.3 mcg/kg/min (06/13/17 1330)  . nitroGLYCERIN Stopped (06/13/17 1330)  . phenylephrine (NEO-SYNEPHRINE) Adult infusion 50 mcg/min (06/13/17 1835)  . vancomycin       Lab Results  Component Value Date   WBC 13.5 (H) 06/13/2017   HGB 11.9 (L) 06/13/2017   HCT 35.0 (L) 06/13/2017   PLT 137 (L) 06/13/2017   GLUCOSE 129 (H) 06/13/2017   ALT 15 (L) 06/11/2017   AST 25 06/11/2017   NA 139 06/13/2017   K 4.4 06/13/2017   CL 105 06/13/2017   CREATININE 0.90 06/13/2017   BUN 15 06/13/2017   CO2 25 06/11/2017   INR 1.55 06/13/2017   HGBA1C 6.3 (H) 06/11/2017   Extubated, neuro intact, not bleeding   [name] B Dazja Houchin  MD  Beeper [phone] Office [phone] 06/13/2017 7:30 PM

## 2017-06-13 NOTE — Progress Notes (Signed)
  Echocardiogram Echocardiogram Transesophageal has been performed.  Eugene Frazier, Eugene Frazier R 06/13/2017, 9:52 AM

## 2017-06-13 NOTE — H&P (Signed)
[address]       [name] [address]             [phone]          CARDIOTHORACIC SURGERY HISTORY AND PHYSICAL EXAM  Referring Provider is Laqueta Linden, MD PCP is Richardean Chimera, MD      Chief Complaint  Patient presents with  . Aortic Stenosis    Surgical eval, Cardiac Cath  and ECHO 05/09/17, Heart CT 05/17/2017    HPI:  Patient is a 61 year old male with history of aortic stenosis, coronary artery disease status post myocardial infarction in 2003 treated with PCI and stenting, and chronic systolic congestive heart failure who has been referred for surgical consultation to discuss treatment options for management of severe symptomatic aortic stenosis and coronary artery disease. The patient states that he developed acute shortness of breath in 2003 and was diagnosed with an acute myocardial infarction. He was treated with PCI and stenting involving "the arteries on the backside of my heart" at [facility]. Details of that procedure are not available. The patient recovered quickly and has not had any further cardiac problems until recently. Beginning last December the patient began to experience progressive symptoms of exertional shortness of breath, orthopnea, and severe lower extremity edema as well as abdominal swelling with scrotal edema. He eventually reestablish care with Dr. Reuel Boom who noted the presence of a systolic murmur on physical exam, diagnosed him with acute on chronic congestive heart failure and started the patient on Lasix. An echocardiogram was performed in early May that reportedly demonstrated the presence of moderate to severe left ventricular systolic dysfunction with ejection fraction estimated 30-35%, bicuspid aortic valve with severe aortic stenosis, mild mitral regurgitation, and mild pulmonary hypertension. The patient's symptoms improved considerably and he lost more than 50 pounds in weight. He was referred  for cardiology consultation and evaluated by Dr. Purvis Sheffield on 05/02/2017.  Repeat echocardiogram performed 05/09/2017 confirmed the presence of severe left ventricular systolic dysfunction and severe aortic stenosis. Left ventricular ejection fraction was estimated 25-30%. Peak velocity across the aortic valve measured greater than 3.9 m/s corresponding to mean transvalvular gradient estimated 41 mmHg. There was mild mitral regurgitation and moderate pulmonary hypertension.  Diagnostic cardiac catheterization was performed 05/09/2017 by Dr. Excell Seltzer.  The patient was noted to have severe single-vessel coronary artery disease involving the right coronary artery, mild nonobstructive disease in the left anterior descending coronary artery and left circumflex coronary artery, and severe aortic stenosis with mean transvalvular gradient measured 37 mmHg corresponding to aortic valve area calculated 0.68 cm. There was severe global left ventricular systolic dysfunction with ejection fraction estimated less than 30%. Cardiac gated CT angiogram of the heart was performed and cardiothoracic surgical consultation was requested.  The patient is single and lives alone in [place]. He is accompanied for his office visit by her close friend. He previously worked for [employer] but more recently works Engineer, water. He has remained physically active and without any significant physical limitations until recently. He states that he experienced progressive symptoms of exertional shortness of breath and fatigue that became noticeable back in December and ultimately prompted his presentation with severe symptoms of exertional shortness of breath, bilateral leg swelling, orthopnea, and abdominal distention. He is doing much better on medical therapy but he still gets short of breath and tired with exertion. He has not had any chest pain or chest tightness either with activity  or at rest. He denies any dizzy  spells or syncope.  Patient returns to the office today for follow-up of aortic stenosis and coronary artery disease with plans to proceed with elective aortic valve replacement and coronary artery bypass grafting on Wednesday, 06/13/2017. He was originally seen in consultation on 05/21/2017. Since then he has remained clinically stable. He underwent dental extraction by Dr. Kristin BruinsKulinski and has been cleared for surgery. He reports no new problems or complaints.  Past Medical History:  Diagnosis Date  . Aortic stenosis   . Arthritis   . Chronic systolic (congestive) heart failure (HCC)   . Coronary artery disease involving native coronary artery of native heart without angina pectoris   . Dyspnea   . Heart murmur   . History of kidney stones   . Hypertension   . Myocardial infarction [facility](HCC) 2003    Past Surgical History:  Procedure Laterality Date  . CORONARY ANGIOPLASTY  2003  . MULTIPLE EXTRACTIONS WITH ALVEOLOPLASTY N/A 05/31/2017   Procedure: Extraction of tooth #'s 2,3,13,14,23,24,26,30 and 31 with alveoloplasty and gross debridement of remaining teeth;  Surgeon: Charlynne PanderKulinski, Ronald F, DDS;  Location: [facility] OR;  Service: Oral Surgery;  Laterality: N/A;  . RIGHT/LEFT HEART CATH AND CORONARY ANGIOGRAPHY N/A 05/09/2017   Procedure: Right/Left Heart Cath and Coronary Angiography;  Surgeon: Tonny Bollmanooper, Michael, MD;  [facility];  Service: Cardiovascular;  Laterality: N/A;    Family History  Problem Relation Age of Onset  . Alzheimer's disease Mother   . Lung disease Father     Social History Social History  Substance Use Topics  . Smoking status: Former Smoker    Packs/day: 2.00    Years: 20.00    Quit date: 05/02/1997  . Smokeless tobacco: Never Used  . Alcohol use No    Prior to Admission medications   Medication Sig Start Date End Date Taking? Authorizing Provider  acetaminophen (TYLENOL) 325 MG tablet Take 650 mg by mouth every 4 (four) hours as needed for mild pain or  moderate pain.   Yes [provider]  aspirin EC 81 MG tablet Take 1 tablet (81 mg total) by mouth daily. 05/02/17  Yes Laqueta LindenKoneswaran, Suresh A, MD  diphenhydrAMINE (BENADRYL) 25 MG tablet Take 25 mg by mouth 2 (two) times daily as needed for itching.    Yes [provider]  furosemide (LASIX) 40 MG tablet Take 40 mg by mouth daily.    Yes [provider]  Ketotifen Fumarate (ALAWAY OP) Apply 1 drop to eye daily as needed (allergies).   Yes [provider]  metoprolol succinate (TOPROL-XL) 25 MG 24 hr tablet Take 25 mg by mouth daily.   Yes [provider]  chlorhexidine (PERIDEX) 0.12 % solution Rinse with 15 mls twice daily for 30 seconds. Use after breakfast and at bedtime. Spit out excess. Do not swallow. 06/11/17   Charlynne PanderKulinski, Ronald F, DDS    Allergies  Allergen Reactions  . Penicillins Hives and Other (See Comments)    FEVER > 104 degrees  Has patient had a PCN reaction causing immediate rash, facial/tongue/throat swelling, SOB or lightheadedness with hypotension: No HAS PATIENT HAD PCN REACTION WITH SEVERE RASH INVOLVING MUCUS MEMBRANES or SKIN NECROSIS: #  #  #  YES  #  #  #  Has patient had a PCN reaction that required hospitalization: No Has patient had a PCN reaction occurring within the last 10 years: No If all of the above answers are "NO", then may proceed  with Cephalosporin use.      Review of Systems:              General:                      decreased appetite, decreased energy, no weight gain, + weight loss, no fever             Cardiac:                       no chest pain with exertion, no chest pain at rest, + SOB with exertion, no resting SOB, no PND, + orthopnea, no palpitations, no arrhythmia, no atrial fibrillation, + LE edema, no dizzy spells, no syncope             Respiratory:                 + shortness of breath, no home oxygen, + productive cough, no dry cough, no bronchitis, no wheezing, no hemoptysis, no asthma, no  pain with inspiration or cough, no sleep apnea, no CPAP at night             GI:                               no difficulty swallowing, no reflux, no frequent heartburn, no hiatal hernia, no abdominal pain, no constipation, no diarrhea, no hematochezia, no hematemesis, no melena             GU:                              no dysuria,   frequency, no urinary tract infection, no hematuria, no enlarged prostate, + kidney stones, no kidney disease             Vascular:                     no pain suggestive of claudication, no pain in feet, no leg cramps, + varicose veins, no DVT, no non-healing foot ulcer             Neuro:                         no stroke, no TIA's, no seizures, no headaches, no temporary blindness one eye,  no slurred speech, + peripheral neuropathy, no chronic pain, no instability of gait, no memory/cognitive dysfunction             Musculoskeletal:         + mild arthritis, no joint swelling, no myalgias, no difficulty walking, normal mobility              Skin:                            + rash, + itching, no skin infections, no pressure sores or ulcerations             Psych:                         + anxiety, + depression, no nervousness, no unusual recent stress             Eyes:  no blurry vision, no floaters, no recent vision changes, + wears glasses or contacts             ENT:                            + hearing loss, + loose or painful teeth, + dentures, last saw dentist many years ago             Hematologic:               + easy bruising, no abnormal bleeding, no clotting disorder, no frequent epistaxis             Endocrine:                   no diabetes, does not check CBG's at home                                                       Physical Exam:              BP 112/72   Pulse 88   Resp 20   Ht 5\' 10"  (1.778 m)   Wt 196 lb (88.9 kg)   SpO2 97% Comment: RA  BMI 28.12 kg/m              General:                         well-appearing             HEENT:                       Unremarkable              Neck:                           no JVD, no bruits, no adenopathy              Chest:                          clear to auscultation, symmetrical breath sounds, no wheezes, no rhonchi              CV:                              RRR, grade III/VI crescendo/decrescendo murmur heard best at LLSB,  no diastolic murmur             Abdomen:                    soft, non-tender, no masses              Extremities:                 warm, well-perfused, pulses palpable, no LE edema             Rectal/GU                   Deferred             Neuro:  Grossly non-focal and symmetrical throughout             Skin:                            Clean and dry, no rashes, no breakdown   Diagnostic Tests:  Transthoracic Echocardiography  Patient: Dijon, Cosens MR #: [id number] Study Date: 05/09/2017 Gender: M Age: 37 Height: 177.8 cm Weight: 90.7 kg BSA: 2.14 m^2 Pt. Status: Room: [facility] Tonny Bollman, MD REFERRING Tonny Bollman, MD PERFORMING [facility], Inpatient SONOGRAPHER Leta Jungling, RDCS  cc:  ------------------------------------------------------------------- LV EF: 25% - 30%  ------------------------------------------------------------------- Indications: Aortic stenosis 424.1.  ------------------------------------------------------------------- History: PMH: Coronary artery disease. Congestive heart failure.  ------------------------------------------------------------------- Study Conclusions  - Left ventricle: The cavity size was normal. Wall thickness was normal. Systolic function was severely reduced. The estimated ejection fraction was in the range of 25% to 30%. Diffuse hypokinesis. Doppler parameters are consistent with restrictive physiology, indicative of decreased left  ventricular diastolic compliance and/or increased left atrial pressure. E/medial e&' > 15, suggesting LVEDP at least 20 mmHg. - Aortic valve: Trileaflet; severely calcified leaflets. There was severe stenosis. Mean gradient (S): 41 mm Hg. Peak gradient (S): 62 mm Hg. Valve area (VTI): 0.85 cm^2. - Mitral valve: There was mild regurgitation. - Left atrium: The atrium was mildly dilated. - Right ventricle: The cavity size was normal. Systolic function was moderately reduced. - Right atrium: The atrium was mildly dilated. - Pulmonary arteries: PA peak pressure: 49 mm Hg (S). - Systemic veins: IVC measured 2.3 cm with < 50% respirophasic variation, suggesting RA pressure 15 mmHg.  Impressions:  - Normal LV size with EF 25-30%, diffuse hypokinesis. Restrictive diastolic function suggestive of elevated LV filling pressure. Normal RV size with mild to moderately decreased systolic function. Severe aortic stenosis. Mild mitral regurgitation. Moderate pulmonary hypertension. Dilated IVC suggestive of elevated RV filling pressure.  ------------------------------------------------------------------- Study data: Comparison was made to the study of 02/15/2017. Study status: Routine. Procedure: The patient reported no pain pre or post test. Transthoracic echocardiography. Image quality was adequate. Study completion: There were no complications. Transthoracic echocardiography. M-mode, complete 2D, spectral Doppler, and color Doppler. Birthdate: Patient birthdate: 1955/12/22. Age: Patient is 61 yr old. Sex: Gender: male. BMI: 28.7 kg/m^2. Blood pressure: 151/49 Patient status: Inpatient. Study date: Study date: 05/09/2017. Study time: 01:40 PM. Location: Bedside.  -------------------------------------------------------------------  ------------------------------------------------------------------- Left ventricle: The cavity size was normal.  Wall thickness was normal. Systolic function was severely reduced. The estimated ejection fraction was in the range of 25% to 30%. Diffuse hypokinesis. Doppler parameters are consistent with restrictive physiology, indicative of decreased left ventricular diastolic compliance and/or increased left atrial pressure. E/medial e&' > 15, suggesting LVEDP at least 20 mmHg.  ------------------------------------------------------------------- Aortic valve: Trileaflet; severely calcified leaflets. Doppler: There was severe stenosis. There was no regurgitation. VTI ratio of LVOT to aortic valve: 0.2. Valve area (VTI): 0.85 cm^2. Indexed valve area (VTI): 0.4 cm^2/m^2. Peak velocity ratio of LVOT to aortic valve: 0.16. Indexed valve area (Vmax): 0.3 cm^2/m^2. Mean velocity ratio of LVOT to aortic valve: 0.14. Indexed valve area (Vmean): 0.27 cm^2/m^2. Mean gradient (S): 41 mm Hg. Peak gradient (S): 62 mm Hg.  ------------------------------------------------------------------- Aorta: Aortic root: The aortic root was normal in size. Ascending aorta: The ascending aorta was normal in size.  ------------------------------------------------------------------- Mitral valve: Normal thickness leaflets . Doppler: There was no evidence for stenosis. There was mild regurgitation. Peak gradient (D): 3 mm  Hg.  ------------------------------------------------------------------- Left atrium: The atrium was mildly dilated.  ------------------------------------------------------------------- Right ventricle: The cavity size was normal. Systolic function was moderately reduced.  ------------------------------------------------------------------- Pulmonic valve: Structurally normal valve. Cusp separation was normal. Doppler: Transvalvular velocity was within the normal range. There was no  regurgitation.  ------------------------------------------------------------------- Tricuspid valve: Doppler: There was mild regurgitation.  ------------------------------------------------------------------- Right atrium: The atrium was mildly dilated.  ------------------------------------------------------------------- Pericardium: There was no pericardial effusion.  ------------------------------------------------------------------- Systemic veins: IVC measured 2.3 cm with <50% respirophasic variation, suggesting RA pressure 15 mmHg.  ------------------------------------------------------------------- Measurements  Left ventricle Value Reference Stroke volume, 2D 50 ml ---------- Stroke volume/bsa, 2D 23 ml/m^2 ---------- LV ejection fraction, 1-p A4C 36 % ---------- LV end-diastolic volume, 2-p 162 ml ---------- LV end-systolic volume, 2-p 102 ml ---------- LV ejection fraction, 2-p 37 % ---------- Stroke volume, 2-p 60 ml ---------- LV end-diastolic volume/bsa, 2-p 76 ml/m^2 ---------- LV end-systolic volume/bsa, 2-p 48 ml/m^2 ---------- Stroke volume/bsa, 2-p 28.1 ml/m^2 ---------- LV e&', lateral 8.11 cm/s ---------- LV E/e&', lateral 11.31 ---------- LV e&', medial 2.92 cm/s ---------- LV E/e&', medial 31.4 ---------- LV e&', average 5.52 cm/s ---------- LV E/e&', average 16.63 ----------  LVOT Value Reference LVOT ID, S  23 mm ---------- LVOT area 4.15 cm^2 ---------- LVOT peak velocity, S 60.9 cm/s ---------- LVOT mean velocity, S 41.7 cm/s ---------- LVOT VTI, S 12 cm ----------  Aortic valve Value Reference Aortic valve peak velocity, S 392 cm/s ---------- Aortic valve mean velocity, S 303 cm/s ---------- Aortic valve VTI, S 58.6 cm ---------- Aortic mean gradient, S 40 mm Hg ---------- Aortic peak gradient, S 61 mm Hg ---------- VTI ratio, LVOT/AV 0.2 ---------- Aortic valve area, VTI 0.85 cm^2 ---------- Aortic valve area/bsa, VTI 0.4 cm^2/m^2 ---------- Velocity ratio, peak, LVOT/AV 0.16 ---------- Aortic valve area/bsa, peak 0.3 cm^2/m^2 ---------- velocity Velocity ratio, mean, LVOT/AV 0.14 ---------- Aortic valve area/bsa, mean 0.27 cm^2/m^2 ---------- velocity  Left atrium Value Reference LA volume, S 45.2 ml ---------- LA volume/bsa, S 21.2 ml/m^2 ---------- LA volume, ES, 1-p A4C 51.3 ml ---------- LA volume/bsa, ES, 1-p A4C 24 ml/m^2 ---------- LA volume, ES, 1-p A2C 40 ml ---------- LA volume/bsa, ES, 1-p A2C 18.7 ml/m^2 ----------  Mitral valve Value Reference Mitral E-wave peak velocity 91.7 cm/s ---------- Mitral deceleration time (L) 134 ms 150 -  230 Mitral peak gradient, D 3 mm Hg ---------- Mitral regurg VTI, PISA 141 cm ----------  Pulmonary arteries Value Reference PA pressure, S, DP (H) 49 mm Hg <=30  Tricuspid valve Value Reference Tricuspid regurg peak velocity 293 cm/s ---------- Tricuspid peak RV-RA gradient 34 mm Hg ----------  Right atrium Value Reference RA ID, S-I, ES, A4C (H) 57.8 mm 34 - 49 RA area, ES, A4C (H) 21.3 cm^2 8.3 - 19.5 RA volume, ES, A/L 65.2 ml ---------- RA volume/bsa, ES, A/L 30.5 ml/m^2 ----------  Right ventricle Value Reference RV ID, minor axis, ED, A4C base 35 mm ---------- TAPSE 10.1 mm ---------- RV s&', lateral, S 6.24 cm/s ----------  Legend: (L) and (H) mark values outside specified reference range.  ------------------------------------------------------------------- Prepared and Electronically Authenticated by  Marca Ancona, M.D. 2018-06-07T11:55:37   Right/Left Heart Cath and Coronary Angiography  Conclusion   1. Severe single-vessel coronary artery disease involving the RCA in this patient with a remote inferior wall infarct 2. Mild nonobstructive LAD and left circumflex stenosis 3. 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 Aortic valve hemodynamics with a mean gradient of 37 mmHg and calculated valve area of 0.68 cm 4. Severe  left ventricular systolic dysfunction with LVEF less than 30% and hemodynamics demonstrating elevated intracardiac filling pressures and preserved cardiac output  Plan: 2-D echocardiogram,  cardiac surgical referral for consideration of treatment options of aortic stenosis in the context of CAD, severe cardiomyopathy, and heart failure  Indications   Severe aortic stenosis [I35.0 (ICD-10-CM)]  Procedural Details/Technique   Technical Details INDICATION: CHF, aortic stenosis. 61 year old gentleman with history of remote inferior wall MI treated emergently with PCI to right coronary artery in 2003. He was lost to follow-up but recently was hospitalized at [facility] with heart failure. An echocardiogram reportedly showed severe LV dysfunction and severe aortic stenosis. He was evaluated as an outpatient by Dr. Purvis Sheffield and referred for right and left heart catheterization.  PROCEDURAL DETAILS: There was an indwelling IV in a right antecubital vein. Using normal sterile technique, the IV was changed out for a 5 Fr brachial sheath over a 0.018 inch wire. The right wrist was then prepped, draped, and anesthetized with 1% lidocaine. Using the modified Seldinger technique a 5/6 French Slender sheath was placed in the right radial artery. Intra-arterial verapamil was administered through the radial artery sheath. IV heparin was administered after a JR4 catheter was advanced into the central aorta. A Swan-Ganz catheter was used for the right heart catheterization. Standard protocol was followed for recording of right heart pressures and sampling of oxygen saturations. Fick cardiac output was calculated. Standard Judkins catheters were used for selective coronary angiography and left ventriculography. The aortic valve was crossed with an AL-1 catheter directing a J-tip wire across the valve. A pullback gradient was measured across the aortic valve. There were no immediate procedural complications. The patient was transferred to the post catheterization recovery area for further monitoring.     Estimated blood loss <50 mL.  During this procedure the patient was administered the following to  achieve and maintain moderate conscious sedation: Versed 1 mg, Fentanyl 25 mcg, while the patient's heart rate, blood pressure, and oxygen saturation were continuously monitored. The period of conscious sedation was 40 minutes, of which I was present face-to-face 100% of this time.    Coronary Findings   Dominance: Right  Left Anterior Descending  Prox LAD to Mid LAD lesion, 40% stenosed.  Left Circumflex  Prox Cx to Mid Cx lesion, 50% stenosed. The lesion is mildly calcified. There is mild to moderate diffuse left circumflex stenosis throughout the mid vessel with no more than 50% luminal narrowing  Right Coronary Artery  Ost RCA to Prox RCA lesion, 75% stenosed. The lesion was previously treated. There are overlapping stents in the proximal right coronary artery hanging out from the ostium of the vessel into the aorta. The stented segment has 75% stenosis.  Mid RCA lesion, 80% stenosed. The lesion is moderately calcified. The entire RCA is diseased. There is diffuse calcification and severe stenosis in the mid vessel.  Right Posterior Descending Artery  Ost RPDA lesion, 90% stenosed. There is tight ostial stenosis in the PDA  Wall Motion              Left Heart   Left Ventricle The left ventricle is dilated. There is severe left ventricular systolic dysfunction. LV end diastolic pressure is severely elevated. The left ventricular ejection fraction is 25-35% by visual estimate. There are LV function abnormalities due to segmental dysfunction. There is severe LV dysfunction. The entire inferior wall from base to apex is akinetic. The anterolateral wall is hypokinetic. The LVEF is estimated at less than 30%.  Aortic Valve There is severe aortic valve stenosis. There is trivial (1+) aortic regurgitation. The aortic valve is calcified. There is restricted aortic valve motion. The aortic valve is calcified with restricted leaflet mobility. The valve has a bicuspid appearance. There is  trivial aortic insufficiency. The mean transvalvular gradient is 37 mmHg. The peak to peak gradient is 48 mmHg and the calculated aortic valve area is 0.68 cm    Coronary Diagrams   Diagnostic Diagram       Implants        No implant documentation for this case.  PACS Images   Show images for Cardiac catheterization   Link to Procedure Log   Procedure Log    Hemo Data    Most Recent Value  Fick Cardiac Output 4.67 L/min  Fick Cardiac Output Index 2.23 (L/min)/BSA  Aortic Mean Gradient 36.7 mmHg  Aortic Peak Gradient 48 mmHg  Aortic Valve Area 0.68  Aortic Value Area Index 0.33 cm2/BSA  RA A Wave 18 mmHg  RA V Wave 15 mmHg  RA Mean 12 mmHg  RV Systolic Pressure 54 mmHg  RV Diastolic Pressure 4 mmHg  RV EDP 18 mmHg  PA Systolic Pressure 55 mmHg  PA Diastolic Pressure 26 mmHg  PA Mean 38 mmHg  PW A Wave 26 mmHg  PW V Wave 33 mmHg  PW Mean 25 mmHg  AO Systolic Pressure 98 mmHg  AO Diastolic Pressure 67 mmHg  AO Mean 80 mmHg  LV Systolic Pressure 150 mmHg  LV Diastolic Pressure 12 mmHg  LV EDP 29 mmHg  Arterial Occlusion Pressure Extended Systolic Pressure 96 mmHg  Arterial Occlusion Pressure Extended Diastolic Pressure 60 mmHg  Arterial Occlusion Pressure Extended Mean Pressure 75 mmHg  Left Ventricular Apex Extended Systolic Pressure 144 mmHg  Left Ventricular Apex Extended Diastolic Pressure 12 mmHg  Left Ventricular Apex Extended EDP Pressure 27 mmHg  QP/QS 1  TPVR Index 17.01 HRUI  TSVR Index 35.83 HRUI  PVR SVR Ratio 0.19  TPVR/TSVR Ratio 0.47    Cardiac TAVR CT  TECHNIQUE: The patient was scanned on a Philips 256 scanner. A 120 kV retrospective scan was triggered in the descending thoracic aorta at 111 HU's. Gantry rotation speed was 270 msecs and collimation was .9 mm. 10 mg of iv Metoprolol and no nitro were given. The 3D data set was reconstructed in 5% intervals of the R-R cycle. Systolic and diastolic phases were analyzed on a  dedicated work station using MPR, MIP and VRT modes. The patient received 80 cc of contrast.  FINDINGS: Aortic Valve: Left and right leaflet are partially co-joined, however the valve opens in a pattern of a tricuspid valve. There is severe thickening and calcification with moderate restriction in leaflet opening. There are no calcifications extending into the LVOT.  Aorta: Normal size, no dissection, mild diffuse calcifications.  Sinotubular Junction: 28 x 26 mm  Ascending Thoracic Aorta: 33 x 33 mm  Aortic Arch: 29 x 27 mm  Descending Thoracic Aorta: 24 x 22 mm  Sinus of Valsalva Measurements:  Non-coronary: 31 mm  Right -coronary: 31 mm  Left -coronary: 30 mm  Coronary Artery Height above Annulus:  Left Main: 9 mm  Right Coronary: 15 mm  Virtual Basal Annulus Measurements:  Maximum/Minimum Diameter: 26 x 23 mm  Perimeter: 79 mm  Area: 477 mm  Optimum Fluoroscopic Angle for Delivery: RAO 2 CRA 1  IMPRESSION: 1. The aortic valve is severely thickened and calcified with partially co-joined left and right leaflet  opening in a pattern of a tricuspid valve. Annular measurements suitable for delivery of a 26 mm Edwards-SAPIEN 3 valve.  2. Borderline annulus to left main height for a 26 mm valve, however there are wide sinuses. Sufficient annulus to RCA distance.  3. Optimum Fluoroscopic Angle for Delivery: RAO 2 CRA 1  4. No thrombus in the left atrial appendage.  Tobias Alexander   Electronically Signed By: Tobias Alexander On: 05/18/2017 11:43      STS Risk Calculator  Procedure                                          AVR + CABG  Risk of Mortality                                1.9% Morbidity or Mortality                       17.4% Prolonged LOS                                   6.5% Short LOS                                           37.0% Permanent Stroke                              1.2% Prolonged Vent Support                     9.7% DSW Infection                                     0.4% Renal Failure                                       3.8% Reoperation                                        7.5%   Impression:  Patient has stage D severe symptomatic aortic stenosis with severe single-vessel coronary artery disease and chronic systolic congestive heart failure. He presented with a gradual progression of symptoms of congestive heart failure over many months until 4-6 weeks ago when he eventually had advanced disease with severe volume overload and class IIIB symptoms of exertional shortness of breath, fatigue, abdominal swelling, and lower extremity edema. He has improved dramatically on medical therapy but he continues to experience exertional shortness of breath consistent with class II symptoms. I have personally reviewed the patient's recent transthoracic echocardiogram and diagnostic cardiac catheterization. The patient has severe global left ventricular systolic dysfunction with ejection fraction estimated less than 30%. 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 There is severe calcification, thickening, and restricted leaflet mobility involving  all 3 leaflets. Despite the presence of severe left ventricular dysfunction, peak velocity across the aortic valve was measured greater than 3.9 m/s corresponding to mean transvalvular gradient estimated 41 mmHg. Diagnostic catheterization confirmed the presence of severe aortic stenosis and revealed severe single-vessel coronary artery disease involving the right coronary artery. Patient also had elevated filling pressures but preserved cardiac output. I agree the patient needs aortic valve replacement. He would probably benefit from concomitant surgical revascularization of the right coronary artery. Risks associated with conventional surgery will be  somewhat elevated because of the degree of left ventricular systolic dysfunction, but still relatively low. Cardiac gated CT angiogram of the heart and aorta demonstrate anatomical characteristics consistent with severe aortic stenosis without any significant complicating features. However, the origin of the left main coronary artery is relatively low and close to the aortic annulus.   Plan:  I have again reviewed the indications, risks, and potential benefits of surgery with the patient and his friend in the office today. Expectations for his postoperative convalescence at been discussed. All of his questions have been addressed. We plan to proceed with surgery on Wednesday morning as previously scheduled.  I spent in excess of 15 minutes during the conduct of this office consultation and >50% of this time involved direct face-to-face encounter with the patient for counseling and/or coordination of their care.    Salvatore Decent. Cornelius Moras, MD 06/11/2017 4:04 PM

## 2017-06-14 ENCOUNTER — Inpatient Hospital Stay (HOSPITAL_COMMUNITY): Payer: BLUE CROSS/BLUE SHIELD

## 2017-06-14 ENCOUNTER — Encounter (HOSPITAL_COMMUNITY): Payer: Self-pay | Admitting: Thoracic Surgery (Cardiothoracic Vascular Surgery)

## 2017-06-14 LAB — BASIC METABOLIC PANEL
Anion gap: 6 (ref 5–15)
BUN: 12 mg/dL (ref 6–20)
CALCIUM: 8.1 mg/dL — AB (ref 8.9–10.3)
CO2: 22 mmol/L (ref 22–32)
CREATININE: 0.98 mg/dL (ref 0.61–1.24)
Chloride: 106 mmol/L (ref 101–111)
GFR calc non Af Amer: >60 mL/min (ref >60)
Glucose, Bld: 126 mg/dL — ABNORMAL HIGH (ref 65–99)
Potassium: 4.1 mmol/L (ref 3.5–5.1)
SODIUM: 134 mmol/L — AB (ref 135–145)

## 2017-06-14 LAB — GLUCOSE, CAPILLARY
GLUCOSE-CAPILLARY: 117 mg/dL — AB (ref 65–99)
GLUCOSE-CAPILLARY: 123 mg/dL — AB (ref 65–99)
GLUCOSE-CAPILLARY: 124 mg/dL — AB (ref 65–99)
GLUCOSE-CAPILLARY: 128 mg/dL — AB (ref 65–99)
GLUCOSE-CAPILLARY: 147 mg/dL — AB (ref 65–99)
GLUCOSE-CAPILLARY: 183 mg/dL — AB (ref 65–99)
Glucose-Capillary: 103 mg/dL — ABNORMAL HIGH (ref 65–99)
Glucose-Capillary: 112 mg/dL — ABNORMAL HIGH (ref 65–99)
Glucose-Capillary: 114 mg/dL — ABNORMAL HIGH (ref 65–99)
Glucose-Capillary: 117 mg/dL — ABNORMAL HIGH (ref 65–99)
Glucose-Capillary: 125 mg/dL — ABNORMAL HIGH (ref 65–99)
Glucose-Capillary: 145 mg/dL — ABNORMAL HIGH (ref 65–99)
Glucose-Capillary: 149 mg/dL — ABNORMAL HIGH (ref 65–99)

## 2017-06-14 LAB — MAGNESIUM
MAGNESIUM: 2.4 mg/dL (ref 1.7–2.4)
Magnesium: 2.1 mg/dL (ref 1.7–2.4)

## 2017-06-14 LAB — POCT I-STAT 3, ART BLOOD GAS (G3+)
ACID-BASE DEFICIT: 1 mmol/L (ref 0.0–2.0)
Bicarbonate: 23.8 mmol/L (ref 20.0–28.0)
O2 SAT: 96 %
PH ART: 7.402 (ref 7.350–7.450)
Patient temperature: 37.7
TCO2: 25 mmol/L (ref 0–100)
pCO2 arterial: 38.5 mmHg (ref 32.0–48.0)
pO2, Arterial: 83 mmHg (ref 83.0–108.0)

## 2017-06-14 LAB — POCT I-STAT, CHEM 8
BUN: 15 mg/dL (ref 6–20)
Calcium, Ion: 1.11 mmol/L — ABNORMAL LOW (ref 1.15–1.40)
Chloride: 97 mmol/L — ABNORMAL LOW (ref 101–111)
Creatinine, Ser: 1 mg/dL (ref 0.61–1.24)
GLUCOSE: 165 mg/dL — AB (ref 65–99)
HEMATOCRIT: 32 % — AB (ref 39.0–52.0)
HEMOGLOBIN: 10.9 g/dL — AB (ref 13.0–17.0)
POTASSIUM: 4.2 mmol/L (ref 3.5–5.1)
SODIUM: 134 mmol/L — AB (ref 135–145)
TCO2: 25 mmol/L (ref 0–100)

## 2017-06-14 LAB — CREATININE, SERUM
Creatinine, Ser: 1.15 mg/dL (ref 0.61–1.24)
GFR calc Af Amer: >60 mL/min (ref >60)

## 2017-06-14 LAB — CBC
HCT: 35.1 % — ABNORMAL LOW (ref 39.0–52.0)
HEMATOCRIT: 35 % — AB (ref 39.0–52.0)
HEMOGLOBIN: 11.6 g/dL — AB (ref 13.0–17.0)
Hemoglobin: 11.6 g/dL — ABNORMAL LOW (ref 13.0–17.0)
MCH: 29.2 pg (ref 26.0–34.0)
MCH: 29.5 pg (ref 26.0–34.0)
MCHC: 33 g/dL (ref 30.0–36.0)
MCHC: 33.1 g/dL (ref 30.0–36.0)
MCV: 88.4 fL (ref 78.0–100.0)
MCV: 89.1 fL (ref 78.0–100.0)
Platelets: 138 10*3/uL — ABNORMAL LOW (ref 150–400)
Platelets: 130 10*3/uL — ABNORMAL LOW (ref 150–400)
RBC: 3.97 MIL/uL — ABNORMAL LOW (ref 4.22–5.81)
RBC: 3.93 MIL/uL — AB (ref 4.22–5.81)
RDW: 15.1 % (ref 11.5–15.5)
RDW: 15.3 % (ref 11.5–15.5)
WBC: 12.9 10*3/uL — ABNORMAL HIGH (ref 4.0–10.5)
WBC: 12.4 10*3/uL — ABNORMAL HIGH (ref 4.0–10.5)

## 2017-06-14 MED ORDER — ENOXAPARIN SODIUM 40 MG/0.4ML ~~LOC~~ SOLN
40.0000 mg | Freq: Every day | SUBCUTANEOUS | Status: DC
  Administered 2017-06-15 – 2017-06-16 (×2): 40 mg via SUBCUTANEOUS
  Filled 2017-06-14 (×2): qty 0.4

## 2017-06-14 MED ORDER — LACTATED RINGERS IV SOLN: INTRAVENOUS | Status: DC

## 2017-06-14 MED ORDER — FUROSEMIDE 10 MG/ML IJ SOLN
20.0000 mg | Freq: Four times a day (QID) | INTRAMUSCULAR | Status: AC
  Administered 2017-06-14 (×3): 20 mg via INTRAVENOUS
  Filled 2017-06-14 (×2): qty 2

## 2017-06-14 MED ORDER — INSULIN ASPART 100 UNIT/ML ~~LOC~~ SOLN
0.0000 [IU] | SUBCUTANEOUS | Status: DC
  Administered 2017-06-14 (×3): 2 [IU] via SUBCUTANEOUS
  Administered 2017-06-14: 4 [IU] via SUBCUTANEOUS
  Administered 2017-06-15: 2 [IU] via SUBCUTANEOUS

## 2017-06-14 MED ORDER — LACTATED RINGERS IV BOLUS (SEPSIS): 20.0000 mL | Freq: Once | INTRAVENOUS | Status: DC

## 2017-06-14 MED FILL — Potassium Chloride Inj 2 mEq/ML: INTRAVENOUS | Qty: 40 | Status: AC

## 2017-06-14 MED FILL — Heparin Sodium (Porcine) Inj 1000 Unit/ML: INTRAMUSCULAR | Qty: 30 | Status: AC

## 2017-06-14 MED FILL — Magnesium Sulfate Inj 50%: INTRAMUSCULAR | Qty: 10 | Status: AC

## 2017-06-14 NOTE — Progress Notes (Signed)
[address]       [name] [address]             [phone]        CARDIOTHORACIC SURGERY PROGRESS NOTE   R1 Day Post-Op Procedure(s) (LRB): AORTIC VALVE REPLACEMENT (AVR) (N/A) CORONARY ARTERY BYPASS GRAFTING (CABG) times one using SVG to Posterior Descending Artery (N/A) TRANSESOPHAGEAL ECHOCARDIOGRAM (TEE) (N/A)  Subjective: Looks good.  Mild soreness in chest.  No SOB.  No nausea  Objective: Vital signs: BP Readings from Last 1 Encounters:  06/14/17 116/63   Pulse Readings from Last 1 Encounters:  06/14/17 89   Resp Readings from Last 1 Encounters:  06/14/17 (!) 30   Temp Readings from Last 1 Encounters:  06/14/17 98.1 F (36.7 C)    Hemodynamics: PAP: (34-55)/(15-26) 43/16 CO:  [3.4 L/min-6.1 L/min] 5 L/min CI:  [1.7 L/min/m2-3 L/min/m2] 2.5 L/min/m2  Physical Exam:  Rhythm:   sinus  Breath sounds: clear  Heart sounds:  RRR w/out murmur  Incisions:  Dressings dry, intact  Abdomen:  Soft, non-distended, non-tender  Extremities:  Warm, well-perfused  Chest tubes:  low volume thin serosanguinous output, no air leak    Intake/Output from previous day: 07/11 0701 - 07/12 0700 In: 5751.6 [I.V.:4784.6; Blood:767; IV Piggyback:200] Out: 3700 [Urine:1875; Blood:1100; Chest Tube:725] Intake/Output this shift: Total I/O In: 45.7 [I.V.:45.7] Out: 100 [Urine:100]  Lab Results:  CBC: Recent Labs  06/13/17 1850 06/13/17 1906 06/14/17 0250  WBC 13.5*  --  12.9*  HGB 12.7* 11.9* 11.6*  HCT 37.6* 35.0* 35.1*  PLT 137*  --  138*    BMET:  Recent Labs  06/11/17 1440  06/13/17 1906 06/14/17 0250  NA 139  < > 139 134*  K 3.5  < > 4.4 4.1  CL 104  < > 105 106  CO2 25  --   --  22  GLUCOSE 123*  < > 129* 126*  BUN 14  < > 15 12  CREATININE 1.09  < > 0.90 0.98  CALCIUM 10.1  --   --  8.1*  < > = values in this interval not displayed.   PT/INR:   Recent Labs  06/13/17 1333  LABPROT 18.8*  INR 1.55    CBG (last 3)    Recent Labs  06/14/17 0456 06/14/17 0554 06/14/17 0651  GLUCAP 117* 112* 103*    ABG    Component Value Date/Time   PHART 7.402 06/14/2017 0303   PCO2ART 38.5 06/14/2017 0303   PO2ART 83.0 06/14/2017 0303   HCO3 23.8 06/14/2017 0303   TCO2 25 06/14/2017 0303   ACIDBASEDEF 1.0 06/14/2017 0303   O2SAT 96.0 06/14/2017 0303    CXR: PORTABLE CHEST 1 VIEW  COMPARISON:  06/13/2017  FINDINGS: Endotracheal tube removed. NG tube removed. Swan-Ganz catheter remains in the right pulmonary artery. Bilateral chest tubes in place. No pneumothorax. Aortic valve replacement unchanged.  Hypoventilation with increase in bibasilar atelectasis compared with yesterday.  IMPRESSION: Hypoventilation with increase in bibasilar atelectasis following extubation. No pneumothorax.   Electronically Signed   By: Marlan Palau M.D.   On: 06/14/2017 08:00   EKG: NSR w/out acute ischemic changes    Assessment/Plan: S/P Procedure(s) (LRB): AORTIC VALVE REPLACEMENT (AVR) (N/A) CORONARY ARTERY BYPASS GRAFTING (CABG) times one using SVG to Posterior Descending Artery (N/A) TRANSESOPHAGEAL ECHOCARDIOGRAM (TEE) (N/A)  Doing well POD1 Maintaining NSR w/ stable hemodynamics on low dose milrinone Breathing comfortably w/ O2 sats 98-100% on 2 L/min Expected  post op atelectasis, mild Expected post op acute blood loss anemia, very mild Chronic systolic CHF with expected post-op volume excess, weight reportedly 7 lbs > preop   Mobilize  Wean milrinone off  D/C lines D/C tubes later today or tomorrow, depending on output Diuresis   Purcell Nailslarence H Lamondre Wesche, MD 06/14/2017 8:30 AM

## 2017-06-14 NOTE — Progress Notes (Signed)
      [address]       [place] [id number]             [phone]      POD # 1 AVR, CABG  No complaints  BP (!) 148/70   Pulse 96   Temp 97.8 F (36.6 C) (Oral)   Resp (!) 33   Ht 5\' 10"  (1.778 m)   Wt 197 lb (89.4 kg)   SpO2 95%   BMI 28.27 kg/m    Intake/Output Summary (Last 24 hours) at 06/14/17 1818 Last data filed at 06/14/17 1800  Gross per 24 hour  Intake          3417.76 ml  Output             2590 ml  Net           827.76 ml   Creatinine 1.0, K= 4.2 Hct= 32  Doing well POD # 1  Sherrick Araki C. Dorris FetchHendrickson, MD [facility] [phone]

## 2017-06-15 ENCOUNTER — Inpatient Hospital Stay (HOSPITAL_COMMUNITY): Payer: BLUE CROSS/BLUE SHIELD

## 2017-06-15 LAB — BASIC METABOLIC PANEL
Anion gap: 8 (ref 5–15)
BUN: 14 mg/dL (ref 6–20)
CALCIUM: 8.5 mg/dL — AB (ref 8.9–10.3)
CO2: 24 mmol/L (ref 22–32)
CREATININE: 1 mg/dL (ref 0.61–1.24)
Chloride: 99 mmol/L — ABNORMAL LOW (ref 101–111)
GFR calc non Af Amer: >60 mL/min (ref >60)
Glucose, Bld: 116 mg/dL — ABNORMAL HIGH (ref 65–99)
Potassium: 3.9 mmol/L (ref 3.5–5.1)
SODIUM: 131 mmol/L — AB (ref 135–145)

## 2017-06-15 LAB — CBC
HCT: 32.9 % — ABNORMAL LOW (ref 39.0–52.0)
Hemoglobin: 11 g/dL — ABNORMAL LOW (ref 13.0–17.0)
MCH: 29.7 pg (ref 26.0–34.0)
MCHC: 33.4 g/dL (ref 30.0–36.0)
MCV: 88.9 fL (ref 78.0–100.0)
PLATELETS: 110 10*3/uL — AB (ref 150–400)
RBC: 3.7 MIL/uL — AB (ref 4.22–5.81)
RDW: 15.5 % (ref 11.5–15.5)
WBC: 12.4 10*3/uL — ABNORMAL HIGH (ref 4.0–10.5)

## 2017-06-15 LAB — POCT I-STAT, CHEM 8
BUN: 20 mg/dL (ref 6–20)
CREATININE: 1 mg/dL (ref 0.61–1.24)
Calcium, Ion: 1.06 mmol/L — ABNORMAL LOW (ref 1.15–1.40)
Chloride: 97 mmol/L — ABNORMAL LOW (ref 101–111)
Glucose, Bld: 175 mg/dL — ABNORMAL HIGH (ref 65–99)
HEMATOCRIT: 34 % — AB (ref 39.0–52.0)
HEMOGLOBIN: 11.6 g/dL — AB (ref 13.0–17.0)
Potassium: 6.4 mmol/L (ref 3.5–5.1)
SODIUM: 132 mmol/L — AB (ref 135–145)
TCO2: 27 mmol/L (ref 0–100)

## 2017-06-15 LAB — GLUCOSE, CAPILLARY
GLUCOSE-CAPILLARY: 120 mg/dL — AB (ref 65–99)
GLUCOSE-CAPILLARY: 166 mg/dL — AB (ref 65–99)
Glucose-Capillary: 108 mg/dL — ABNORMAL HIGH (ref 65–99)
Glucose-Capillary: 146 mg/dL — ABNORMAL HIGH (ref 65–99)

## 2017-06-15 MED ORDER — ATORVASTATIN CALCIUM 80 MG PO TABS
80.0000 mg | ORAL_TABLET | Freq: Every day | ORAL | Status: DC
  Filled 2017-06-15 (×2): qty 1

## 2017-06-15 MED ORDER — METOPROLOL TARTRATE 12.5 MG HALF TABLET
12.5000 mg | ORAL_TABLET | Freq: Two times a day (BID) | ORAL | Status: AC
  Administered 2017-06-15 (×2): 12.5 mg via ORAL
  Filled 2017-06-15 (×2): qty 1

## 2017-06-15 MED ORDER — METOPROLOL SUCCINATE ER 25 MG PO TB24
25.0000 mg | ORAL_TABLET | Freq: Every day | ORAL | Status: DC
  Administered 2017-06-16 – 2017-06-17 (×2): 25 mg via ORAL
  Filled 2017-06-15 (×2): qty 1

## 2017-06-15 MED ORDER — ASPIRIN EC 325 MG PO TBEC
325.0000 mg | DELAYED_RELEASE_TABLET | Freq: Every day | ORAL | Status: DC
  Administered 2017-06-15 – 2017-06-17 (×3): 325 mg via ORAL
  Filled 2017-06-15 (×3): qty 1

## 2017-06-15 MED ORDER — SODIUM CHLORIDE 0.9 % IV SOLN
250.0000 mL | INTRAVENOUS | Status: DC | PRN
Start: 2017-06-15 — End: 2017-06-17

## 2017-06-15 MED ORDER — FUROSEMIDE 40 MG PO TABS
40.0000 mg | ORAL_TABLET | Freq: Every day | ORAL | Status: DC
  Administered 2017-06-15 – 2017-06-17 (×3): 40 mg via ORAL
  Filled 2017-06-15 (×3): qty 1

## 2017-06-15 MED ORDER — MOVING RIGHT ALONG BOOK
Freq: Once | Status: AC
  Administered 2017-06-15: 1
  Filled 2017-06-15: qty 1

## 2017-06-15 MED ORDER — SODIUM CHLORIDE 0.9% FLUSH
3.0000 mL | Freq: Two times a day (BID) | INTRAVENOUS | Status: DC
  Administered 2017-06-15 – 2017-06-16 (×3): 3 mL via INTRAVENOUS

## 2017-06-15 MED ORDER — LISINOPRIL 10 MG PO TABS
10.0000 mg | ORAL_TABLET | Freq: Every day | ORAL | Status: DC
  Administered 2017-06-16 – 2017-06-17 (×2): 10 mg via ORAL
  Filled 2017-06-15 (×2): qty 1

## 2017-06-15 MED ORDER — SODIUM CHLORIDE 0.9% FLUSH: 3.0000 mL | INTRAVENOUS | Status: DC | PRN

## 2017-06-15 MED ORDER — POTASSIUM CHLORIDE 10 MEQ/50ML IV SOLN
10.0000 meq | INTRAVENOUS | Status: AC
  Administered 2017-06-15 (×2): 10 meq via INTRAVENOUS
  Filled 2017-06-15 (×2): qty 50

## 2017-06-15 MED ORDER — POTASSIUM CHLORIDE CRYS ER 20 MEQ PO TBCR
20.0000 meq | EXTENDED_RELEASE_TABLET | Freq: Every day | ORAL | Status: DC
  Administered 2017-06-16 – 2017-06-17 (×2): 20 meq via ORAL
  Filled 2017-06-15 (×3): qty 1

## 2017-06-15 NOTE — Discharge Instructions (Signed)
Aortic Valve Replacement, Care After °Refer to this sheet in the next few weeks. These instructions provide you with information about caring for yourself after your procedure. Your health care provider may also give you more specific instructions. Your treatment has been planned according to current medical practices, but problems sometimes occur. Call your health care provider if you have any problems or questions after your procedure. °What can I expect after the procedure? °After the procedure, it is common to have: °· Pain around your incision area. °· A small amount of blood or clear fluid coming from your incision. ° °Follow these instructions at home: °Eating and drinking ° °· Follow instructions from your health care provider about eating or drinking restrictions. °? Limit alcohol intake to no more than 1 drink per day for nonpregnant women and 2 drinks per day for men. One drink equals 12 oz of beer, 5 oz of wine, or 1½ oz of hard liquor. °? Limit how much caffeine you drink. Caffeine can affect your heart's rate and rhythm. °· Drink enough fluid to keep your urine clear or pale yellow. °· Eat a heart-healthy diet. This should include plenty of fresh fruits and vegetables. If you eat meat, it should be lean cuts. Avoid foods that are: °? High in salt, saturated fat, or sugar. °? Canned or highly processed. °? Fried. °Activity °· Return to your normal activities as told by your health care provider. Ask your health care provider what activities are safe for you. °· Exercise regularly once you have recovered, as told by your health care provider. °· Avoid sitting for more than 2 hours at a time without moving. Get up and move around at least once every 1-2 hours. This helps to prevent blood clots in the legs. °· Do not lift anything that is heavier than 10 lb (4.5 kg) until your health care provider approves. °· Avoid pushing or pulling things with your arms until your health care provider approves. This  includes pulling on handrails to help you climb stairs. °Incision care ° °· Follow instructions from your health care provider about how to take care of your incision. Make sure you: °? Wash your hands with soap and water before you change your bandage (dressing). If soap and water are not available, use hand sanitizer. °? Change your dressing as told by your health care provider. °? Leave stitches (sutures), skin glue, or adhesive strips in place. These skin closures may need to stay in place for 2 weeks or longer. If adhesive strip edges start to loosen and curl up, you may trim the loose edges. Do not remove adhesive strips completely unless your health care provider tells you to do that. °· Check your incision area every day for signs of infection. Check for: °? More redness, swelling, or pain. °? More fluid or blood. °? Warmth. °? Pus or a bad smell. °Medicines °· Take over-the-counter and prescription medicines only as told by your health care provider. °· If you were prescribed an antibiotic medicine, take it as told by your health care provider. Do not stop taking the antibiotic even if you start to feel better. °Travel °· Avoid airplane travel for as long as told by your health care provider. °· When you travel, bring a list of your medicines and a record of your medical history with you. Carry your medicines with you. °Driving °· Ask your health care provider when it is safe for you to drive. Do not drive until your health   care provider approves. °· Do not drive or operate heavy machinery while taking prescription pain medicine. °Lifestyle ° °· Do not use any tobacco products, such as cigarettes, chewing tobacco, or e-cigarettes. If you need help quitting, ask your health care provider. °· Resume sexual activity as told by your health care provider. Do not use medicines for erectile dysfunction unless your health care provider approves, if this applies. °· Work with your health care provider to keep your  blood pressure and cholesterol under control, and to manage any other heart conditions that you have. °· Maintain a healthy weight. °General instructions °· Do not take baths, swim, or use a hot tub until your health care provider approves. °· Do not strain to have a bowel movement. °· Avoid crossing your legs while sitting down. °· Check your temperature every day for a fever. A fever may be a sign of infection. °· If you are a woman and you plan to become pregnant, talk with your health care provider before you become pregnant. °· Wear compression stockings if your health care provider instructs you to do this. These stockings help to prevent blood clots and reduce swelling in your legs. °· Tell all health care providers who care for you that you have an artificial (prosthetic) aortic valve. If you have or have had heart disease or endocarditis, tell all health care providers about these conditions as well. °· Keep all follow-up visits as told by your health care provider. This is important. °Contact a health care provider if: °· You develop a skin rash. °· You experience sudden, unexplained changes in your weight. °· You have more redness, swelling, or pain around your incision. °· You have more fluid or blood coming from your incision. °· Your incision feels warm to the touch. °· You have pus or a bad smell coming from your incision. °· You have a fever. °Get help right away if: °· You develop chest pain that is different from the pain coming from your incision. °· You develop shortness of breath or difficulty breathing. °· You start to feel light-headed. °These symptoms may represent a serious problem that is an emergency. Do not wait to see if the symptoms will go away. Get medical help right away. Call your local emergency services (911 in the [place] Do not drive yourself to the hospital. °This information is not intended to replace advice given to you by your health care provider. Make sure you discuss any  questions you have with your health care provider. °Document Released: 06/08/2005 Document Revised: 04/27/2016 Document Reviewed: 10/24/2015 °Elsevier Interactive Patient Education © 2017 [employer] ° ° °Endoscopic Saphenous Vein Harvesting, Care After °Refer to this sheet in the next few weeks. These instructions provide you with information about caring for yourself after your procedure. Your health care provider may also give you more specific instructions. Your treatment has been planned according to current medical practices, but problems sometimes occur. Call your health care provider if you have any problems or questions after your procedure. °What can I expect after the procedure? °After the procedure, it is common to have: °· Pain. °· Bruising. °· Swelling. °· Numbness. ° °Follow these instructions at home: °Medicine °· Take over-the-counter and prescription medicines only as told by your health care provider. °· Do not drive or operate heavy machinery while taking prescription pain medicine. °Incision care ° °· Follow instructions from your health care provider about how to take care of the cut made during surgery (incision). Make   sure you: °? Wash your hands with soap and water before you change your bandage (dressing). If soap and water are not available, use hand sanitizer. °? Change your dressing as told by your health care provider. °? Leave stitches (sutures), skin glue, or adhesive strips in place. These skin closures may need to be in place for 2 weeks or longer. If adhesive strip edges start to loosen and curl up, you may trim the loose edges. Do not remove adhesive strips completely unless your health care provider tells you to do that. °· Check your incision area every day for signs of infection. Check for: °? More redness, swelling, or pain. °? More fluid or blood. °? Warmth. °? Pus or a bad smell. °General instructions °· Raise (elevate) your legs above the level of your heart while you are  sitting or lying down. °· Do any exercises your health care providers have given you. These may include deep breathing, coughing, and walking exercises. °· Do not shower, take baths, swim, or use a hot tub unless told by your health care provider. °· Wear your elastic stocking if told by your health care provider. °· Keep all follow-up visits as told by your health care provider. This is important. °Contact a health care provider if: °· Medicine does not help your pain. °· Your pain gets worse. °· You have new leg bruises or your leg bruises get bigger. °· You have a fever. °· Your leg feels numb. °· You have more redness, swelling, or pain around your incision. °· You have more fluid or blood coming from your incision. °· Your incision feels warm to the touch. °· You have pus or a bad smell coming from your incision. °Get help right away if: °· Your pain is severe. °· You develop pain, tenderness, warmth, redness, or swelling in any part of your leg. °· You have chest pain. °· You have trouble breathing. °This information is not intended to replace advice given to you by your health care provider. Make sure you discuss any questions you have with your health care provider. °Document Released: 08/02/2011 Document Revised: 04/27/2016 Document Reviewed: 10/04/2015 °Elsevier Interactive Patient Education © 2018 [employer] ° ° °

## 2017-06-15 NOTE — Discharge Summary (Signed)
Physician Discharge Summary  Patient ID: Eugene Frazier MRN: [id number] DOB/AGE: 1956/08/17 61 y.o.  Admit date: 06/13/2017 Discharge date: 06/17/2017  Admission Diagnoses:  Patient Active Problem List   Diagnosis Date Noted  . Coronary artery disease involving native coronary artery of native heart without angina pectoris   . Chronic systolic (congestive) heart failure (HCC)   . Severe aortic stenosis 05/09/2017   Discharge Diagnoses:   Patient Active Problem List   Diagnosis Date Noted  . 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 S/P CABG x 1 06/13/2017  . Coronary artery disease involving native coronary artery of native heart without angina pectoris   . Chronic systolic (congestive) heart failure (HCC)   . Severe aortic stenosis 05/09/2017   Discharged Condition: good  History of Present Illness:  Mr. Eugene Frazier is a 61 year-old male with history of aortic stenosis, coronary artery disease status post myocardial infarction in 2003 treated with PCI and stenting, and chronic systolic congestive heart failure who has been referred for surgical consultation to discuss treatment options for management of severe symptomatic aortic stenosis and coronary artery disease. The patient states that he developed acute shortness of breath in 2003 and was diagnosed with an acute myocardial infarction. He was treated with PCI and stenting involving "the arteries on the backside of my heart" at [facility]. Details of that procedure are not available. The patient recovered quickly and has not had any further cardiac problems until recently. Beginning last December the patient began to experience progressive symptoms of exertional shortness of breath, orthopnea, and severe lower extremity edema as well as abdominal swelling with scrotal edema. He eventually reestablish care with Dr. Reuel Boomaniel who noted the presence of a systolic murmur on physical exam,diagnosed  him with acute on chronic congestive heart failure and started the patient on Lasix. An echocardiogram was performed in early May that reportedly demonstrated the presence of moderate to severe left ventricular systolic dysfunction with ejection fraction estimated 30-35%, bicuspid aortic valve with severe aortic stenosis, mild mitral regurgitation, and mild pulmonary hypertension. The patient's symptoms improved considerably and he lost more than 50 pounds in weight. He was referred for cardiology consultation and evaluated by Dr. Purvis SheffieldKoneswaran on 05/02/2017. Repeat echocardiogram performed 05/09/2017 confirmed the presence of severe left ventricular systolic dysfunction and severe aortic stenosis. Left ventricular ejection fraction was estimated 25-30%. Peak velocity across the aortic valve measured greater than 3.1280m/s corresponding to mean transvalvular gradient estimated 41 mmHg. There was mild mitral regurgitation and moderate pulmonary hypertension. Diagnostic cardiac catheterization was performed 05/09/2017 by Dr. Excell Seltzerooper. The patient was noted to have severe single-vessel coronary artery disease involving the right coronary artery, mild nonobstructive disease in the left anterior descending coronary artery and left circumflex coronary artery, and severe aortic stenosis with mean transvalvular gradient measured 37 mmHg corresponding to aortic valve area calculated 0.68 cm. There was severe global left ventricular systolic dysfunction with ejection fraction estimated less than 30%. Cardiac gated CT angiogram of the heart was performed and cardiothoracic surgical consultation was requested.  He was evaluated by Dr. Cornelius Moraswen who was in agreement the patient would benefit from Aortic Valve Replacement and coronary bypass grafting procedure.  The risks and benefits of the procedure were explained to the patient and he was agreeable to proceed.   Hospital Course:   Mr. Eugene Frazier presented [facility] on  06[facility]13/2017.  He was taken to the operating room and underwent CABG x 1 utilizing  SVG to PDA and Aortic Valve Replacement with a Edwards 23mm Intuity Elite Rapid Deployment valve.  He tolerated the procedure without difficulty and was taken the SICU in stable condition.  He was extubated the evening of surgery.  During his stay in the SICU the patient was weaned off Milrinone as tolerated.  His chest tubes and arterial lines were removed on POD #2.  He was diuresed for hypervolemia with good response to Lasix.  He was maintaining NSR and felt medically stable for transfer to the step down unit on POD #2.  He continued to make progress.  He continued to maintain NSR.  His pacing wires were removed without difficulty on 06/17/2017. He will be restarted on low dose Lisinopril for better BP control. He is ambulating on room air. He is tolerating a diet and has had a bowel movement.  Significant Diagnostic Studies: cardiac graphics:   Echocardiogram: - Left ventricle: The cavity size was normal. Wall thickness was   normal. Systolic function was severely reduced. The estimated   ejection fraction was in the range of 25% to 30%. Diffuse   hypokinesis. Doppler parameters are consistent with restrictive   physiology, indicative of decreased left ventricular diastolic   compliance and/or increased left atrial pressure. E/medial e&' >   15, suggesting LVEDP at least 20 mmHg. - Aortic valve: Trileaflet; severely calcified leaflets. There was   severe stenosis. Mean gradient (S): 41 mm Hg. Peak gradient (S):   62 mm Hg. Valve area (VTI): 0.85 cm^2. - Mitral valve: There was mild regurgitation. - Left atrium: The atrium was mildly dilated. - Right ventricle: The cavity size was normal. Systolic function   was moderately reduced. - Right atrium: The atrium was mildly dilated. - Pulmonary arteries: PA peak pressure: 49 mm Hg (S). - Systemic veins: IVC measured 2.3 cm with < 50% respirophasic   variation,  suggesting RA pressure 15 mmHg.  Impressions:  - Normal LV size with EF 25-30%, diffuse hypokinesis. Restrictive   diastolic function suggestive of elevated LV filling pressure.   Normal RV size with mild to moderately decreased systolic   function. Severe aortic stenosis. Mild mitral regurgitation.   Moderate pulmonary hypertension. Dilated IVC suggestive of   elevated RV filling pressure.  Angiography:   1. Severe single-vessel coronary artery disease involving the RCA in this patient with a remote inferior wall infarct 2. Mild nonobstructive LAD and left circumflex stenosis 3. 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 Aortic valve hemodynamics with a mean gradient of 37 mmHg and calculated valve area of 0.68 cm 4. Severe left ventricular systolic dysfunction with LVEF less than 30% and hemodynamics demonstrating elevated intracardiac filling pressures and preserved cardiac output  Treatments: surgery:    Aortic Valve Replacement             Edwards Intuity Elite Rapid Deployment Bovine Pericardial Tissue Valve (size 23mm, model # 8300AB, serial # [id number])   Coronary Artery Bypass Grafting x 1             Reversed Greater Saphenous Vein Graft to Posterior Descending Coronary Artery             Endoscopic Vein Harvest from Right Thigh   Disposition: 01-Home or Self Care   Discharge Medications:   Discharge Instructions    Amb Referral to Cardiac Rehabilitation    Complete by:  As directed    Diagnosis:   CABG Valve Replacement     Valve:  Aortic   CABG X  ___:  1     Allergies as of 06/17/2017      Reactions   Penicillins Hives, Other (See Comments)   FEVER > 104 degrees Has patient had a PCN reaction causing immediate rash, facial/tongue/throat swelling, SOB or lightheadedness with hypotension: No HAS PATIENT HAD PCN REACTION WITH SEVERE RASH INVOLVING MUCUS MEMBRANES or SKIN NECROSIS: #  #  #  YES  #  #  #  Has patient had a PCN reaction that  required hospitalization: No Has patient had a PCN reaction occurring within the last 10 years: No If all of the above answers are "NO", then may proceed with Cephalosporin use.      Medication List    TAKE these medications   acetaminophen 325 MG tablet Commonly known as:  TYLENOL Take 650 mg by mouth every 4 (four) hours as needed for mild pain or moderate pain.   ALAWAY OP Apply 1 drop to eye daily as needed (allergies).   aspirin 325 MG EC tablet Take 1 tablet (325 mg total) by mouth daily. What changed:  medication strength  how much to take   atorvastatin 80 MG tablet Commonly known as:  LIPITOR Take 1 tablet (80 mg total) by mouth daily at 6 PM.   chlorhexidine 0.12 % solution Commonly known as:  PERIDEX Rinse with 15 mls twice daily for 30 seconds. Use after breakfast and at bedtime. Spit out excess. Do not swallow.   diphenhydrAMINE 25 MG tablet Commonly known as:  BENADRYL Take 25 mg by mouth 2 (two) times daily as needed for itching.   furosemide 40 MG tablet Commonly known as:  LASIX Take 40 mg by mouth daily.   lisinopril 2.5 MG tablet Commonly known as:  PRINIVIL,ZESTRIL Take 1 tablet (2.5 mg total) by mouth daily.   metoprolol succinate 25 MG 24 hr tablet Commonly known as:  TOPROL-XL Take 25 mg by mouth daily.   oxyCODONE 5 MG immediate release tablet Commonly known as:  Oxy IR/ROXICODONE Take 5 mg by mouth every 4-6 hours PRN severe pain.   potassium chloride SA 20 MEQ tablet Commonly known as:  K-DUR,KLOR-CON Take 1 tablet (20 mEq total) by mouth daily.      The patient has been discharged on:   1.Beta Blocker:  Yes [ x ]                              No   [   ]                              If No, reason:  2.Ace Inhibitor/ARB: Yes [ x  ]                                     No  [    ]                                     If No, reason:  3.Statin:   Yes [ x  ]                  No  [   ]  If No, reason:  4.Ecasa:   Yes  [ x  ]                  No   [   ]                  If No, reason:  Follow-up Information    [facility] Follow up on 07/16/2017.   Specialty:  Cardiothoracic Surgery Why:  Appointment is at 1:15, please get CXR at 12:45 located on first floor of our office building Contact information: [address] [place], [address] [place] [address] [phone]       Laqueta Linden, MD Follow up on 07/09/2017.   Specialty:  Cardiology Why:  Appointment is at 11:20 Contact information: [address] [place] [address] [phone]        Richardean Chimera, MD Follow up.   Specialty:  Family Medicine Why:  Call for a follow up appointment regarding further surveillance of HGA1C 6.3 (pre diabetes) Contact information: [address] [place] [address] [phone]           Signed: Ardelle Balls 06/17/2017, 11:04 AM

## 2017-06-15 NOTE — Progress Notes (Signed)
[address]       [place] [id number]             [phone]        CARDIOTHORACIC SURGERY PROGRESS NOTE   R2 Days Post-Op Procedure(s) (LRB): AORTIC VALVE REPLACEMENT (AVR) (N/A) CORONARY ARTERY BYPASS GRAFTING (CABG) times one using SVG to Posterior Descending Artery (N/A) TRANSESOPHAGEAL ECHOCARDIOGRAM (TEE) (N/A)  Subjective: No complaints.  Looks good  Objective: Vital signs: BP Readings from Last 1 Encounters:  06/15/17 (!) 141/69   Pulse Readings from Last 1 Encounters:  06/15/17 96   Resp Readings from Last 1 Encounters:  06/15/17 17   Temp Readings from Last 1 Encounters:  06/15/17 98.4 F (36.9 C) (Oral)    Hemodynamics: PAP: (37-43)/(15-20) 42/20  Physical Exam:  Rhythm:   sinus  Breath sounds: clear  Heart sounds:  RRR  Incisions:  Dressing dry, intact  Abdomen:  Soft, non-distended, non-tender  Extremities:  Warm, well-perfused  Chest tubes:  low volume thin serosanguinous output, no air leak    Intake/Output from previous day: 07/12 0701 - 07/13 0700 In: 1500.5 [P.O.:840; I.V.:540.5; IV Piggyback:120] Out: 2065 [Urine:1675; Chest Tube:390] Intake/Output this shift: No intake/output data recorded.  Lab Results:  CBC: Recent Labs  06/14/17 1506  06/14/17 1521 06/15/17 0304  WBC 12.4*  --   --  12.4*  HGB 11.6*  < > 10.9* 11.0*  HCT 35.0*  < > 32.0* 32.9*  PLT 130*  --   --  110*  < > = values in this interval not displayed.  BMET:  Recent Labs  06/14/17 0250  06/14/17 1521 06/14/17 1555 06/15/17 0304  NA 134*  < > 134*  --  131*  K 4.1  < > 4.2  --  3.9  CL 106  < > 97*  --  99*  CO2 22  --   --   --  24  GLUCOSE 126*  < > 165*  --  116*  BUN 12  < > 15  --  14  CREATININE 0.98  < > 1.00 1.15 1.00  CALCIUM 8.1*  --   --   --  8.5*  < > = values in this interval not displayed.   PT/INR:   Recent Labs  06/13/17 1333  LABPROT 18.8*  INR 1.55    CBG (last 3)   Recent Labs  06/14/17 1919  06/14/17 2336 06/15/17 0334  GLUCAP 149* 123* 120*    ABG    Component Value Date/Time   PHART 7.402 06/14/2017 0303   PCO2ART 38.5 06/14/2017 0303   PO2ART 83.0 06/14/2017 0303   HCO3 23.8 06/14/2017 0303   TCO2 25 06/14/2017 1521   ACIDBASEDEF 1.0 06/14/2017 0303   O2SAT 96.0 06/14/2017 0303    CXR: PORTABLE CHEST 1 VIEW  COMPARISON:  June 14, 2017  FINDINGS: There is a mediastinal drain. Cordis tip is in the superior vena cava. Swan-Ganz catheter has been removed. No pneumothorax. There is consolidation in the left lower lobe with small left pleural effusion. There is slight right base atelectasis. Right lung otherwise is clear. There is stable cardiomegaly with pulmonary vascularity within normal limits. There is aortic atherosclerosis. There is evidence of aortic valve replacement  IMPRESSION: Tube and catheter positions as described without evident pneumothorax. Left lower lobe consolidation with small left pleural effusion, essentially stable from 1 day prior. Mild right base atelectasis. Stable cardiomegaly. There is aortic atherosclerosis.  Aortic Atherosclerosis (ICD10-I70.0).  Electronically Signed   By: Bretta Bang III M.D.   On: 06/15/2017 08:04  Assessment/Plan: S/P Procedure(s) (LRB): AORTIC VALVE REPLACEMENT (AVR) (N/A) CORONARY ARTERY BYPASS GRAFTING (CABG) times one using SVG to Posterior Descending Artery (N/A) TRANSESOPHAGEAL ECHOCARDIOGRAM (TEE) (N/A)  Doing very well POD2 Maintaining NSR w/ stable BP off drips Breathing comfortably w/ O2 sats 93-94% on RA Expected post op atelectasis, mild Expected post op acute blood loss anemia, very mild Chronic systolic CHF with expected post-op volume excess, weight stable and reportedly 7 lbs > preop   Mobilize  Diuresis  D/C tubes  Continue ASA, beta blocker - add statin  Consider adding ACE 1-2 days if BP will allow  Transfer 4E  Anticipate possible d/c home 2-3  days   [name], MD 06/15/2017 7:43 AM

## 2017-06-16 ENCOUNTER — Inpatient Hospital Stay (HOSPITAL_COMMUNITY): Payer: BLUE CROSS/BLUE SHIELD

## 2017-06-16 LAB — CBC
HEMATOCRIT: 33.3 % — AB (ref 39.0–52.0)
HEMOGLOBIN: 11.1 g/dL — AB (ref 13.0–17.0)
MCH: 29.7 pg (ref 26.0–34.0)
MCHC: 33.3 g/dL (ref 30.0–36.0)
MCV: 89 fL (ref 78.0–100.0)
Platelets: 118 10*3/uL — ABNORMAL LOW (ref 150–400)
RBC: 3.74 MIL/uL — AB (ref 4.22–5.81)
RDW: 15.5 % (ref 11.5–15.5)
WBC: 9.4 10*3/uL (ref 4.0–10.5)

## 2017-06-16 LAB — BASIC METABOLIC PANEL
Anion gap: 8 (ref 5–15)
BUN: 16 mg/dL (ref 6–20)
CHLORIDE: 100 mmol/L — AB (ref 101–111)
CO2: 24 mmol/L (ref 22–32)
CREATININE: 0.96 mg/dL (ref 0.61–1.24)
Calcium: 8.9 mg/dL (ref 8.9–10.3)
GFR calc Af Amer: >60 mL/min (ref >60)
GFR calc non Af Amer: >60 mL/min (ref >60)
GLUCOSE: 114 mg/dL — AB (ref 65–99)
POTASSIUM: 3.9 mmol/L (ref 3.5–5.1)
Sodium: 132 mmol/L — ABNORMAL LOW (ref 135–145)

## 2017-06-16 MED ORDER — LACTULOSE 10 GM/15ML PO SOLN
20.0000 g | Freq: Once | ORAL | Status: AC
  Administered 2017-06-16: 20 g via ORAL
  Filled 2017-06-16: qty 30

## 2017-06-16 MED ORDER — POTASSIUM CHLORIDE CRYS ER 20 MEQ PO TBCR
20.0000 meq | EXTENDED_RELEASE_TABLET | Freq: Once | ORAL | Status: AC
  Administered 2017-06-16: 20 meq via ORAL

## 2017-06-16 NOTE — Progress Notes (Addendum)
      [address]       [place] [id number]             [phone]        3 Days Post-Op Procedure(s) (LRB): AORTIC VALVE REPLACEMENT (AVR) (N/A) CORONARY ARTERY BYPASS GRAFTING (CABG) times one using SVG to Posterior Descending Artery (N/A) TRANSESOPHAGEAL ECHOCARDIOGRAM (TEE) (N/A)  Subjective: Patient passing flatus but no bowel movement yet.  Objective: Vital signs in last 24 hours: Temp:  [97.9 F (36.6 C)-99 F (37.2 C)] 98.6 F (37 C) (07/14 0423) Pulse Rate:  [92-105] 98 (07/14 0423) Cardiac Rhythm: Normal sinus rhythm (07/13 2100) Resp:  [19-27] 21 (07/14 0423) BP: (123-159)/(69-110) 130/70 (07/14 0423) SpO2:  [90 %-97 %] 95 % (07/14 0423) Weight:  [89.3 kg (196 lb 13.9 oz)-89.6 kg (197 lb 8.5 oz)] 89.6 kg (197 lb 8.5 oz) (07/14 0423)  Pre op weight 89.4 kg Current Weight  06/16/17 89.6 kg (197 lb 8.5 oz)      Intake/Output from previous day: 07/13 0701 - 07/14 0700 In: 890 [P.O.:720; I.V.:70; IV Piggyback:100] Out: 2075 [Urine:2055; Chest Tube:20]   Physical Exam:  Cardiovascular: RRR, soft flow murmur Pulmonary: Clear to auscultation bilaterally Abdomen: Soft, non tender, bowel sounds present. Extremities:Trace bilateral lower extremity edema. Wounds: Aquacel removed and sternal wound is clean and dry.  No erythema or signs of infection. RLE wound is clean and dry.  Lab Results: CBC: Recent Labs  06/15/17 0304 06/16/17 0439  WBC 12.4* 9.4  HGB 11.0* 11.1*  HCT 32.9* 33.3*  PLT 110* 118*   BMET:  Recent Labs  06/15/17 0304 06/16/17 0439  NA 131* 132*  K 3.9 3.9  CL 99* 100*  CO2 24 24  GLUCOSE 116* 114*  BUN 14 16  CREATININE 1.00 0.96  CALCIUM 8.5* 8.9    PT/INR:  Lab Results  Component Value Date   INR 1.55 06/13/2017   INR 1.15 06/11/2017   INR 1.15 05/03/2017   ABG:  INR: Will add last result for INR, ABG once components are confirmed Will add last 4 CBG results once components are  confirmed  Assessment/Plan:  1. CV - SR/PVCs in the 90's. On Toprol XL 25 mg daily, Lisinopril 10 mg daily. 2.  Pulmonary - On room air. CXR this am shows improving LLL atelectasis, stable cardiomegaly. Encourage incentive spirometer.  3. Volume Overload - On Lasix 40 mg daily 4.  Acute blood loss anemia - H and H stable at 11.1 and 33.3. 5. Thrombocytopenia-platelets up to 118,000 6. Supplement potassium 7. CBGs 108/146/166. Pre op HGA1C 6.3. He is likely pre diabetic and will need further follow up with medical doctor after discharge.  8. Remove EPW 9. LOC constipation  ZIMMERMAN,DONIELLE MPA-C 06/16/2017,9:03 AM  Doing well , wires out today  Poss home tomorrow  I have seen and examined Eugene Inglesoyle [name] and agree with the above assessment  and plan.  [name] B Amonie Wisser MD [place] [phone] Office [phone] 06/16/2017 1:10 PM

## 2017-06-16 NOTE — Progress Notes (Signed)
CARDIAC REHAB PHASE I   PRE:  Rate/Rhythm: 102 ST   BP:  Sitting: 155/84       SaO2: 93 RA  MODE:  Ambulation: 470 ft   POST:  Rate/Rhythm: 115 ST w/ PVCs  BP:  Sitting: 154/80        SaO2: 92 RA  Pt ambulated well w/o assistance.  Reviewed education with pt and discussed sternal precautions, exercise/nutrition, IS use, signs and symptoms and when to call Dr./911.  Also discussed cardiac rehab PhII, will refer to APCRPII.  Pt verbalized understanding.  Returned to recliner w/ call bell in reach. 10:45-11:23   Eugene SpecterAshley L Noha Milberger, MS 06/16/2017 11:19 AM

## 2017-06-17 MED ORDER — ASPIRIN 325 MG PO TBEC: 325.0000 mg | DELAYED_RELEASE_TABLET | Freq: Every day | ORAL | 0 refills | Status: AC

## 2017-06-17 MED ORDER — OXYCODONE HCL 5 MG PO TABS: ORAL_TABLET | ORAL | 0 refills | Status: DC

## 2017-06-17 MED ORDER — POTASSIUM CHLORIDE CRYS ER 20 MEQ PO TBCR: 20.0000 meq | EXTENDED_RELEASE_TABLET | Freq: Every day | ORAL | 1 refills | Status: DC

## 2017-06-17 MED ORDER — ATORVASTATIN CALCIUM 80 MG PO TABS: 80.0000 mg | ORAL_TABLET | Freq: Every day | ORAL | 1 refills | Status: DC

## 2017-06-17 MED ORDER — LISINOPRIL 2.5 MG PO TABS: 2.5000 mg | ORAL_TABLET | Freq: Every day | ORAL | 1 refills | Status: DC

## 2017-06-17 NOTE — Progress Notes (Signed)
DC instructions given to pt at this time.  Pt verbalized understanding of all instructions.  DC'd CT sutures.  No c/o pain.  No s/s of any acute distress.

## 2017-06-17 NOTE — Progress Notes (Signed)
      [address]       [place] [id number]             [phone]        4 Days Post-Op Procedure(s) (LRB): AORTIC VALVE REPLACEMENT (AVR) (N/A) CORONARY ARTERY BYPASS GRAFTING (CABG) times one using SVG to Posterior Descending Artery (N/A) TRANSESOPHAGEAL ECHOCARDIOGRAM (TEE) (N/A)  Subjective: Patient had bowel movement. He wants to go home as he has friends coming into town.  Objective: Vital signs in last 24 hours: Temp:  [97.8 F (36.6 C)-98.5 F (36.9 C)] 98.2 F (36.8 C) (07/15 0825) Pulse Rate:  [91-102] 102 (07/15 0825) Cardiac Rhythm: Normal sinus rhythm (07/14 1900) Resp:  [17-24] 17 (07/15 0825) BP: (102-125)/(52-89) 125/59 (07/15 0825) SpO2:  [96 %-100 %] 99 % (07/15 0825) Weight:  [87.8 kg (193 lb 9 oz)] 87.8 kg (193 lb 9 oz) (07/15 0358)  Pre op weight 89.4 kg Current Weight  06/17/17 87.8 kg (193 lb 9 oz)      Intake/Output from previous day: 07/14 0701 - 07/15 0700 In: 840 [P.O.:840] Out: 300 [Urine:300]   Physical Exam:  Cardiovascular: RRR, soft flow murmur Pulmonary: Clear to auscultation bilaterally Abdomen: Soft, non tender, bowel sounds present. Extremities:Trace bilateral lower extremity edema. Wounds: Aquacel removed and sternal wound is clean and dry.  No erythema or signs of infection. RLE wound is clean and dry.  Lab Results: CBC:  Recent Labs  06/15/17 0304 06/16/17 0439  WBC 12.4* 9.4  HGB 11.0* 11.1*  HCT 32.9* 33.3*  PLT 110* 118*   BMET:   Recent Labs  06/15/17 0304 06/16/17 0439  NA 131* 132*  K 3.9 3.9  CL 99* 100*  CO2 24 24  GLUCOSE 116* 114*  BUN 14 16  CREATININE 1.00 0.96  CALCIUM 8.5* 8.9    PT/INR:  Lab Results  Component Value Date   INR 1.55 06/13/2017   INR 1.15 06/11/2017   INR 1.15 05/03/2017   ABG:  INR: Will add last result for INR, ABG once components are confirmed Will add last 4 CBG results once components are confirmed  Assessment/Plan:  1. CV - SR/PVCs  in the low 100's. On Toprol XL 25 mg daily, Lisinopril 10 mg daily. 2.  Pulmonary - On room air. CXR this am shows improving LLL atelectasis, stable cardiomegaly. Encourage incentive spirometer.  3. Volume Overload - On Lasix 40 mg daily 4.  Acute blood loss anemia - H and H stable at 11.1 and 33.3. 5. Thrombocytopenia-last platelets up to 118,000 6. Remove EPW-ordered yesterday but not done 7. Will discuss disposition with Dr. Tyrone SageGerhardt.  ZIMMERMAN,DONIELLE MPA-C 06/17/2017,8:42 AM

## 2017-06-17 NOTE — Progress Notes (Signed)
Epicardial pacing wires d/c as per PA orders.  Bedrest until 1030

## 2017-06-18 ENCOUNTER — Other Ambulatory Visit: Payer: Self-pay | Admitting: *Deleted

## 2017-06-18 DIAGNOSIS — I10 Essential (primary) hypertension: Secondary | ICD-10-CM

## 2017-06-18 DIAGNOSIS — Z79899 Other long term (current) drug therapy: Secondary | ICD-10-CM

## 2017-06-18 MED ORDER — LISINOPRIL 2.5 MG PO TABS: 2.5000 mg | ORAL_TABLET | Freq: Every day | ORAL | 1 refills | Status: AC

## 2017-06-18 MED ORDER — POTASSIUM CHLORIDE CRYS ER 20 MEQ PO TBCR: 20.0000 meq | EXTENDED_RELEASE_TABLET | Freq: Every day | ORAL | 1 refills | Status: AC

## 2017-06-19 ENCOUNTER — Encounter (HOSPITAL_COMMUNITY): Payer: Self-pay | Admitting: Thoracic Surgery (Cardiothoracic Vascular Surgery)

## 2017-07-09 ENCOUNTER — Telehealth: Payer: Self-pay | Admitting: Cardiovascular Disease

## 2017-07-09 ENCOUNTER — Ambulatory Visit (INDEPENDENT_AMBULATORY_CARE_PROVIDER_SITE_OTHER): Payer: BLUE CROSS/BLUE SHIELD | Admitting: Cardiovascular Disease

## 2017-07-09 ENCOUNTER — Encounter: Payer: Self-pay | Admitting: Cardiovascular Disease

## 2017-07-09 VITALS — BP 107/71 | HR 79 | Ht 70.0 in | Wt 179.0 lb

## 2017-07-09 DIAGNOSIS — I5022 Chronic systolic (congestive) heart failure: Secondary | ICD-10-CM

## 2017-07-09 DIAGNOSIS — Z951 Presence of aortocoronary bypass graft: Secondary | ICD-10-CM

## 2017-07-09 DIAGNOSIS — Z952 Presence of prosthetic heart valve: Secondary | ICD-10-CM

## 2017-07-09 DIAGNOSIS — I10 Essential (primary) hypertension: Secondary | ICD-10-CM | POA: Diagnosis not present

## 2017-07-09 DIAGNOSIS — I519 Heart disease, unspecified: Secondary | ICD-10-CM | POA: Diagnosis not present

## 2017-07-09 NOTE — Progress Notes (Signed)
SUBJECTIVE: The patient presents for follow-up after undergoing bioprosthetic aortic valve replacement and one-vessel CABG with SVG to PDA on 06/13/17.  He also has chronic systolic heart failure. TEE on 06/13/17 showed severely reduced left ventricular systolic function with moderate left ventricular dilatation, LVEF 20-25%.  He denies chest pain and shortness of breath as well as leg swelling, orthopnea, and paroxysmal nocturnal dyspnea. He said he is slowly regaining his energy.  He is scheduled to see CT surgery on 07/16/17.    Review of Systems: As per "subjective", otherwise negative.  Allergies  Allergen Reactions  . Penicillins Hives and Other (See Comments)    FEVER > 104 degrees  Has patient had a PCN reaction causing immediate rash, facial/tongue/throat swelling, SOB or lightheadedness with hypotension: No HAS PATIENT HAD PCN REACTION WITH SEVERE RASH INVOLVING MUCUS MEMBRANES or SKIN NECROSIS: #  #  #  YES  #  #  #  Has patient had a PCN reaction that required hospitalization: No Has patient had a PCN reaction occurring within the last 10 years: No If all of the above answers are "NO", then may proceed with Cephalosporin use.   . Lipitor [Atorvastatin] Other (See Comments)    Muscle aches    Current Outpatient Prescriptions  Medication Sig Dispense Refill  . acetaminophen (TYLENOL) 325 MG tablet Take 650 mg by mouth every 4 (four) hours as needed for mild pain or moderate pain.    Marland Kitchen. aspirin EC 325 MG EC tablet Take 1 tablet (325 mg total) by mouth daily. 30 tablet 0  . chlorhexidine (PERIDEX) 0.12 % solution Rinse with 15 mls twice daily for 30 seconds. Use after breakfast and at bedtime. Spit out excess. Do not swallow. 480 mL prn  . diphenhydrAMINE (BENADRYL) 25 MG tablet Take 25 mg by mouth 2 (two) times daily as needed for itching.     . furosemide (LASIX) 40 MG tablet Take 40 mg by mouth daily.     Marland Kitchen. Ketotifen Fumarate (ALAWAY OP) Apply 1 drop to eye daily as  needed (allergies).    Marland Kitchen. lisinopril (PRINIVIL,ZESTRIL) 2.5 MG tablet Take 1 tablet (2.5 mg total) by mouth daily. 30 tablet 1  . metoprolol succinate (TOPROL-XL) 25 MG 24 hr tablet Take 25 mg by mouth daily.    . potassium chloride SA (K-DUR,KLOR-CON) 20 MEQ tablet Take 1 tablet (20 mEq total) by mouth daily. 30 tablet 1   No current facility-administered medications for this visit.     Past Medical History:  Diagnosis Date  . Aortic stenosis   . Arthritis   . Chronic systolic (congestive) heart failure (HCC)   . Coronary artery disease involving native coronary artery of native heart without angina pectoris   . Dyspnea   . Heart murmur   . History of kidney stones   . Hypertension   . Myocardial infarction (HCC) 2003  . S/P aortic valve replacement with bioprosthetic valve 06/13/2017   23 mm Edwards Intuity Elite bovine pericardial rapid deployment bioprosthetic tissue valve    Past Surgical History:  Procedure Laterality Date  . AORTIC VALVE REPLACEMENT N/A 06/13/2017   Procedure: AORTIC VALVE REPLACEMENT (AVR);  Surgeon: Purcell Nailswen, Clarence H, MD;  [facility];  Service: Open Heart Surgery;  Laterality: N/A;  . CORONARY ANGIOPLASTY  2003  . CORONARY ARTERY BYPASS GRAFT N/A 06/13/2017   Procedure: CORONARY ARTERY BYPASS GRAFTING (CABG) times one using SVG to Posterior Descending Artery;  Surgeon: Purcell Nailswen, Clarence H, MD;  Location:  [facility] OR;  Service: Open Heart Surgery;  Laterality: N/A;  . MULTIPLE EXTRACTIONS WITH ALVEOLOPLASTY N/A 05/31/2017   Procedure: Extraction of tooth #'s 2,3,13,14,23,24,26,30 and 31 with alveoloplasty and gross debridement of remaining teeth;  Surgeon: Charlynne Pander, DDS;  Location: [facility] OR;  Service: Oral Surgery;  Laterality: N/A;  . RIGHT/LEFT HEART CATH AND CORONARY ANGIOGRAPHY N/A 05/09/2017   Procedure: Right/Left Heart Cath and Coronary Angiography;  Surgeon: Tonny Bollman, MD;  Location: [facility] INVASIVE CV LAB;  Service: Cardiovascular;  Laterality: N/A;  .  TEE WITHOUT CARDIOVERSION N/A 06/13/2017   Procedure: TRANSESOPHAGEAL ECHOCARDIOGRAM (TEE);  Surgeon: [name], MD;  Location: [facility] OR;  Service: Open Heart Surgery;  Laterality: N/A;    Social History   Social History  . Marital status: Divorced    Spouse name: N/A  . Number of children: 1  . Years of education: N/A   Occupational History  . Not on file.   Social History Main Topics  . Smoking status: Former Smoker    Packs/day: 2.00    Years: 20.00    Quit date: 05/02/1997  . Smokeless tobacco: Never Used  . Alcohol use No  . Drug use: No  . Sexual activity: Not on file   Other Topics Concern  . Not on file   Social History Narrative   Patient is divorced 2. Patient had one son with his first wife.   Patient with a history of smoking 2 packs per day for 20 years. Patient quit in 1998.   Patient never used smokeless tobacco.   Patient denies use of drugs or alcohol.     Vitals:   07/09/17 1118  BP: 107/71  Pulse: 79  SpO2: 100%  Weight: 179 lb (81.2 kg)  Height: 5\' 10"  (1.778 m)    Wt Readings from Last 3 Encounters:  07/09/17 179 lb (81.2 kg)  06/17/17 193 lb 9 oz (87.8 kg)  06/11/17 190 lb (86.2 kg)     PHYSICAL EXAM General: NAD HEENT: Normal. Neck: No JVD, no thyromegaly. Lungs: Clear to auscultation bilaterally with normal respiratory effort. CV: Nondisplaced PMI.  Regular rate and rhythm, normal S1/S2, no S3/S4, no murmur. No pretibial or periankle edema.    Abdomen: Soft, nontender, no distention.  Neurologic: Alert and oriented.  Psych: Normal affect. Skin: Normal. Musculoskeletal: No gross deformities.    ECG: Most recent ECG reviewed.   Labs: Lab Results  Component Value Date/Time   K 3.9 06/16/2017 04:39 AM   BUN 16 06/16/2017 04:39 AM   CREATININE 0.96 06/16/2017 04:39 AM   ALT 15 (L) 06/11/2017 02:40 PM   HGB 11.1 (L) 06/16/2017 04:39 AM     Lipids: No results found for: LDLCALC, LDLDIRECT, CHOL, TRIG, HDL      ASSESSMENT AND PLAN: 1. Coronary artery disease status post 1 vessel CABG: Symptomatically stable. Continue aspirin and metoprolol. Statin intolerant. I discussed cardiac rehabilitation but he deferred. I will obtain a copy of lipids from PCP for review.  2. Chronic systolic heart failure, LVEF 20-25%: Euvolemic. Continue lisinopril and metoprolol succinate. Continue Lasix.  3. Severe aortic stenosis status post bioprosthetic aortic valve replacement: Stable.  4. Hypertension: Controlled on lisinopril and Toprol-XL. No changes.  5. Cardiomyopathy/severe left ventricular dysfunction: I will repeat an echocardiogram in 4 months. If LVEF remains severely reduced, I will refer to EP for AICD candidacy.      Disposition: Follow up 4 months.   Prentice Docker, M.D., F.A.C.C.

## 2017-07-09 NOTE — Patient Instructions (Addendum)
Medication Instructions:  Continue all current medications.  Labwork: none  Testing/Procedures:  Your physician has requested that you have an echocardiogram. Echocardiography is a painless test that uses sound waves to create images of your heart. It provides your doctor with information about the size and shape of your heart and how well your heart's chambers and valves are working. This procedure takes approximately one hour. There are no restrictions for this procedure. (DUE IN 4 MONTHS)  Office will contact with results via phone or letter.    Follow-Up: 1 week after Echo in 4 months   Any Other Special Instructions Will Be Listed Below (If Applicable).  If you need a refill on your cardiac medications before your next appointment, please call your pharmacy.

## 2017-07-09 NOTE — Telephone Encounter (Signed)
[facility]

## 2017-07-09 NOTE — Addendum Note (Signed)
Addended by: Lesle ChrisHILL, Omarius Grantham G on: 07/09/2017 11:36 AM   Modules accepted: Orders

## 2017-07-13 ENCOUNTER — Encounter: Payer: Self-pay | Admitting: *Deleted

## 2017-07-13 ENCOUNTER — Other Ambulatory Visit: Payer: Self-pay | Admitting: Thoracic Surgery (Cardiothoracic Vascular Surgery)

## 2017-07-13 DIAGNOSIS — Z951 Presence of aortocoronary bypass graft: Secondary | ICD-10-CM

## 2017-07-16 ENCOUNTER — Ambulatory Visit (INDEPENDENT_AMBULATORY_CARE_PROVIDER_SITE_OTHER): Payer: Self-pay | Admitting: Surgical

## 2017-07-16 ENCOUNTER — Ambulatory Visit
Admission: RE | Admit: 2017-07-16 | Discharge: 2017-07-16 | Disposition: A | Discharge status: Home / Self Care | Payer: BLUE CROSS/BLUE SHIELD | Source: Ambulatory Visit | Attending: Thoracic Surgery (Cardiothoracic Vascular Surgery) | Admitting: Thoracic Surgery (Cardiothoracic Vascular Surgery)

## 2017-07-16 VITALS — BP 104/68 | HR 51 | Resp 20 | Ht 70.0 in | Wt 179.0 lb

## 2017-07-16 DIAGNOSIS — Z951 Presence of aortocoronary bypass graft: Secondary | ICD-10-CM

## 2017-07-16 DIAGNOSIS — I350 Nonrheumatic aortic (valve) stenosis: Secondary | ICD-10-CM

## 2017-07-16 DIAGNOSIS — I2510 Atherosclerotic heart disease of native coronary artery without angina pectoris: Secondary | ICD-10-CM

## 2017-07-16 NOTE — Patient Instructions (Signed)
Activity and driving progression discussed with patient and he understands.

## 2017-07-16 NOTE — Progress Notes (Signed)
[address]       [place] [address]             [phone]      Eugene Frazier [facility] Medical Record #[id number] Date of Birth: [birth year]  Referring: Laqueta Linden, MD Primary Care: Richardean Chimera, MD  Chief Complaint:   POST OP FOLLOW UP CARDIOTHORACIC SURGERY OPERATIVE NOTE  Date of Procedure:                06/13/2017  Preoperative Diagnosis:       ? Severe Aortic Stenosis ? Severe Single-vessel Coronary Artery Disease  Postoperative Diagnosis:    Same  Procedure:        Aortic Valve Replacement             Edwards Intuity Elite Rapid Deployment Bovine Pericardial Tissue Valve (size 23mm, model # 8300AB, serial # [id number])   Coronary Artery Bypass Grafting x 1             Reversed Greater Saphenous Vein Graft to Posterior Descending Coronary Artery             Endoscopic Vein Harvest from Right Thigh   Surgeon:        Salvatore Decent. 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 He reports overall that he feels much better than he did prior to surgery. He denies shortness of breath and has only minimal chest incisional discomfort for which Tylenol is his only pain medication. His ambulation is improving daily. He has some minor lower extremity edema but this is also improved. He denies fevers, chills or other significant constitutional symptoms. He has had no difficulty with his incisions.      Past Medical History:  Diagnosis Date  . Aortic stenosis   . Arthritis   . Chronic systolic (congestive) heart failure (HCC)   .  Coronary artery disease involving native coronary artery of native heart without angina pectoris   . Dyspnea   . Heart murmur   . History of kidney stones   . Hypertension   . Myocardial infarction (HCC) 2003  . S/P aortic valve replacement with bioprosthetic valve 06/13/2017   23 mm Edwards Intuity Elite bovine pericardial rapid deployment bioprosthetic tissue valve     History  Smoking Status  . Former Smoker  . Packs/day: 2.00  . Years: 20.00  . Quit date: 05/02/1997  Smokeless Tobacco  . Never Used    History  Alcohol Use No     Allergies  Allergen Reactions  . Penicillins Hives and Other (See Comments)    FEVER > 104 degrees  Has patient had a PCN reaction causing immediate rash, facial/tongue/throat swelling, SOB or lightheadedness with hypotension: No HAS PATIENT HAD PCN REACTION WITH SEVERE RASH INVOLVING MUCUS MEMBRANES or SKIN NECROSIS: #  #  #  YES  #  #  #  Has patient had a PCN reaction that required hospitalization: No Has patient had a PCN reaction occurring within the last 10 years:  No If all of the above answers are "NO", then may proceed with Cephalosporin use.   . Lipitor [Atorvastatin] Other (See Comments)    Muscle aches    Current Outpatient Prescriptions  Medication Sig Dispense Refill  . acetaminophen (TYLENOL) 325 MG tablet Take 650 mg by mouth every 4 (four) hours as needed for mild pain or moderate pain.    Marland Kitchen. aspirin EC 325 MG EC tablet Take 1 tablet (325 mg total) by mouth daily. 30 tablet 0  . chlorhexidine (PERIDEX) 0.12 % solution Rinse with 15 mls twice daily for 30 seconds. Use after breakfast and at bedtime. Spit out excess. Do not swallow. 480 mL prn  . diphenhydrAMINE (BENADRYL) 25 MG tablet Take 25 mg by mouth 2 (two) times daily as needed for itching.     . furosemide (LASIX) 40 MG tablet Take 40 mg by mouth daily.     Marland Kitchen. Ketotifen Fumarate (ALAWAY OP) Apply 1 drop to eye daily as needed (allergies).    Marland Kitchen. lisinopril (PRINIVIL,ZESTRIL)  2.5 MG tablet Take 1 tablet (2.5 mg total) by mouth daily. 30 tablet 1  . metoprolol succinate (TOPROL-XL) 25 MG 24 hr tablet Take 25 mg by mouth daily.    . potassium chloride SA (K-DUR,KLOR-CON) 20 MEQ tablet Take 1 tablet (20 mEq total) by mouth daily. 30 tablet 1   No current facility-administered medications for this visit.        Physical Exam: BP 104/68   Pulse (!) 51   Resp 20   Ht 5\' 10"  (1.778 m)   Wt 179 lb (81.2 kg)   SpO2 99% Comment: RA  BMI 25.68 kg/m   General appearance: alert, cooperative and no distress Heart: regular rate and rhythm and occas ectopic Lungs: clear to auscultation bilaterally Abdomen: benign Extremities: minor lower ext edema Wound: Incisions well-healed without evidence of infection   Diagnostic Studies & Laboratory data:     Recent Radiology Findings:   Dg Chest 2 View  Result Date: 07/16/2017 CLINICAL DATA:  61 year old male post CABG 06/13/2017. No chest complaints. Subsequent encounter. EXAM: CHEST  2 VIEW COMPARISON:  06/16/2017 chest x-ray.  05/17/2017 chest CT. FINDINGS: Post aortic valve replacement. Heart size within normal limits. Clearing of basilar atelectasis/ pleural effusion. Coronary artery calcification. Calcification aorta. Prominent right nipple shadow. CT detected pulmonary nodules not as well delineated as on recent CT. Follow-up as recommended on prior CT report. Mild thoracic kyphosis with degenerative changes without focal compression fracture. IMPRESSION: Post aortic valve replacement.  Heart size within normal limits. Coronary artery calcification. Clearing of basilar atelectasis/ pleural effusion. Aortic Atherosclerosis (ICD10-I70.0). CT detected pulmonary nodules not as well delineated as on recent CT. Follow-up as recommended on recent CT report. Electronically Signed   By: Lacy DuverneySteven  Olson M.D.   On: 07/16/2017 12:51      Recent Lab Findings: Lab Results  Component Value Date   WBC 9.4 06/16/2017   HGB 11.1 (L)  06/16/2017   HCT 33.3 (L) 06/16/2017   PLT 118 (L) 06/16/2017   GLUCOSE 114 (H) 06/16/2017   ALT 15 (L) 06/11/2017   AST 25 06/11/2017   NA 132 (L) 06/16/2017   K 3.9 06/16/2017   CL 100 (L) 06/16/2017   CREATININE 0.96 06/16/2017   BUN 16 06/16/2017   CO2 24 06/16/2017   INR 1.55 06/13/2017   HGBA1C 6.3 (H) 06/11/2017      Assessment / Plan:  The patient has continued to make excellent progress since discharge from the  hospital. We discussed routine activity progression protocols including driving. He did ask about returning to work but this involves significant exertion and heavy lifting summary is not felt to be a candidate at this time as no light duty options are available. He has been seen by cardiology in follow-up and they plan to get an echocardiogram on December 12. We will see again in the office in December after that has been performed or prior to that on an as-needed basis.          Filippa Yarbough E, PA-C 07/16/2017 1:12 PM

## 2017-08-17 ENCOUNTER — Encounter: Payer: Self-pay | Admitting: *Deleted

## 2017-08-17 ENCOUNTER — Other Ambulatory Visit: Payer: Self-pay | Admitting: *Deleted

## 2017-08-17 ENCOUNTER — Other Ambulatory Visit: Payer: Self-pay | Admitting: Physician Assistant

## 2017-08-17 DIAGNOSIS — Z79899 Other long term (current) drug therapy: Secondary | ICD-10-CM

## 2017-08-17 DIAGNOSIS — I10 Essential (primary) hypertension: Secondary | ICD-10-CM

## 2017-08-17 DIAGNOSIS — Z953 Presence of xenogenic heart valve: Secondary | ICD-10-CM

## 2017-08-17 DIAGNOSIS — Z789 Other specified health status: Secondary | ICD-10-CM

## 2017-08-17 DIAGNOSIS — I2510 Atherosclerotic heart disease of native coronary artery without angina pectoris: Secondary | ICD-10-CM

## 2017-09-27 ENCOUNTER — Encounter: Payer: Self-pay | Admitting: *Deleted

## 2017-11-14 ENCOUNTER — Other Ambulatory Visit: Payer: Self-pay

## 2017-11-19 ENCOUNTER — Ambulatory Visit: Payer: Self-pay | Admitting: Thoracic Surgery (Cardiothoracic Vascular Surgery)

## 2017-11-21 ENCOUNTER — Ambulatory Visit: Payer: Self-pay | Admitting: Cardiovascular Disease

## 2018-06-12 IMAGING — CR DG CHEST 2V
2 series · 2 of 2 positions shown · non-contrast
Comparison: 05/03/2017

CLINICAL DATA: Aortic stenosis, multi vessel coronary artery
disease post MI, hypertension

EXAM:
CHEST  2 VIEW

[w chest pa]
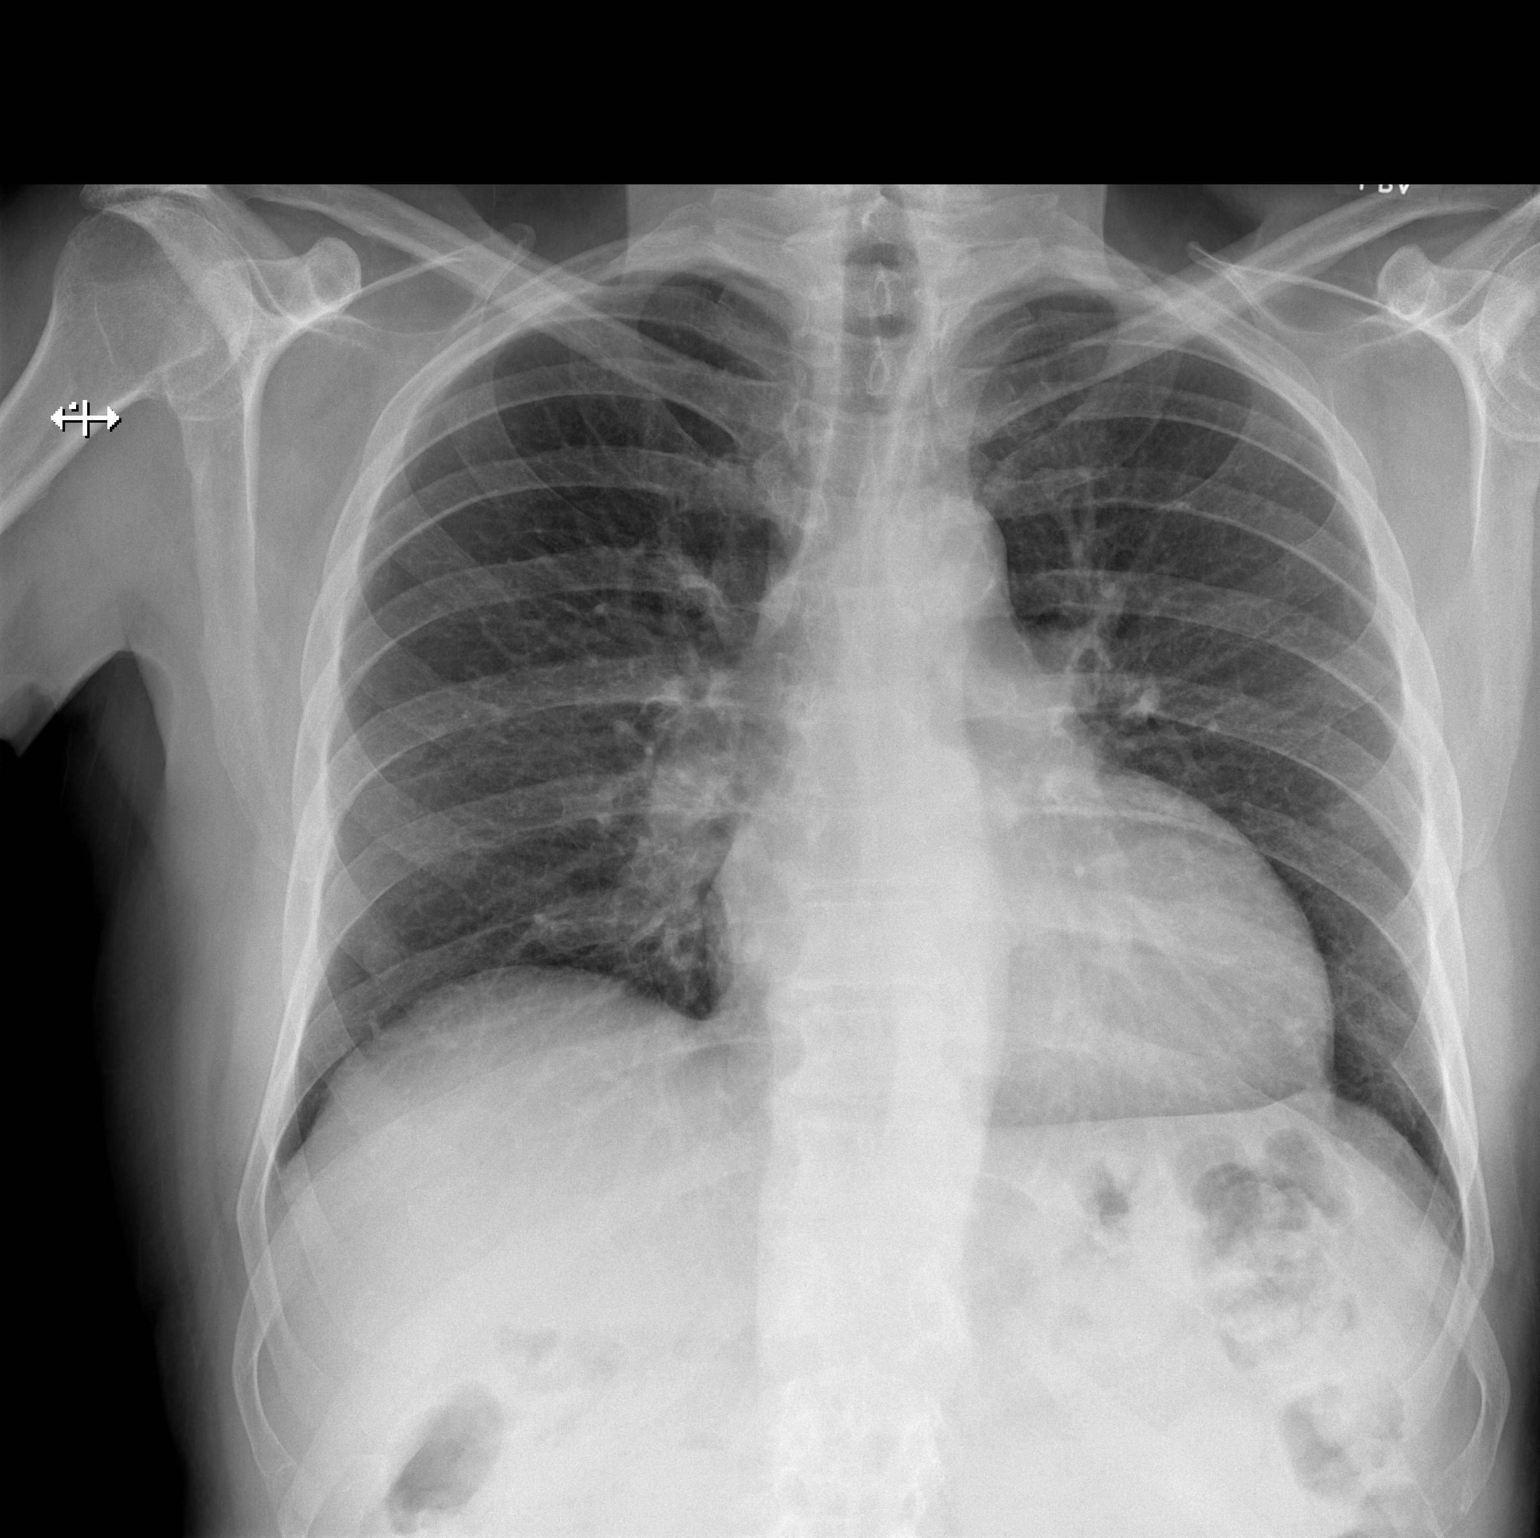

[w chest lat]
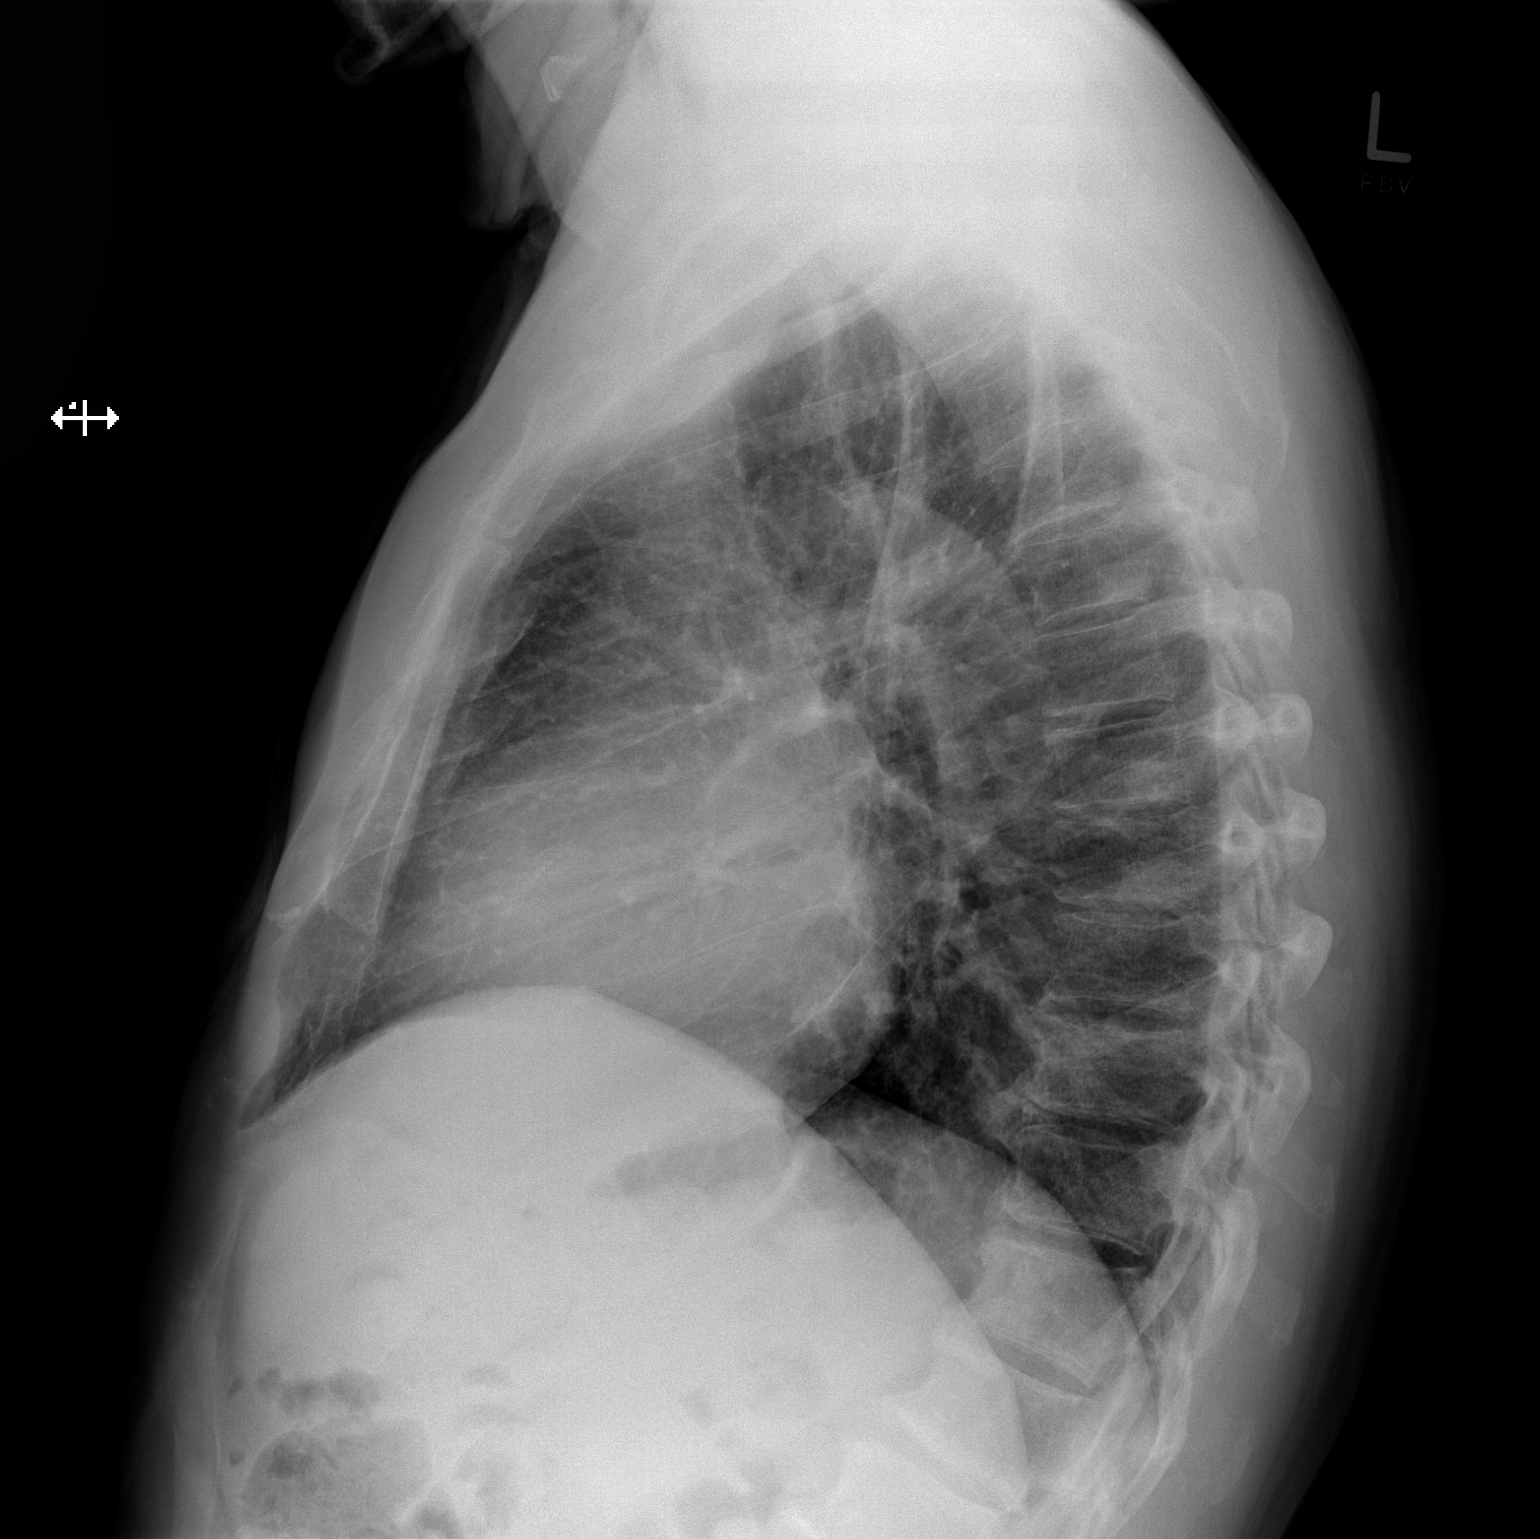

[2 of 2 positions shown; findings below may reference images not displayed]

FINDINGS: Mild enlargement of cardiac silhouette.

Mediastinal contours and pulmonary vascularity normal.

Lungs clear.

No pleural effusion or pneumothorax.

RIGHT nipple shadow again identified, with no RIGHT pulmonary nodule
identified on interval coronary CTA.

Incidental note of atherosclerotic calcification aorta.

No acute osseous findings.
IMPRESSION: Enlargement of cardiac silhouette.

No acute abnormalities.

Aortic Atherosclerosis ([id number]).

## 2018-06-14 IMAGING — DX DG CHEST 1V PORT
1 series · 1 of 1 positions shown · non-contrast
Comparison: Two-view chest x-ray 06/11/2017

CLINICAL DATA: Aortic valve replacement.  Median sternotomy.

EXAM:
PORTABLE CHEST 1 VIEW

[chest ap]
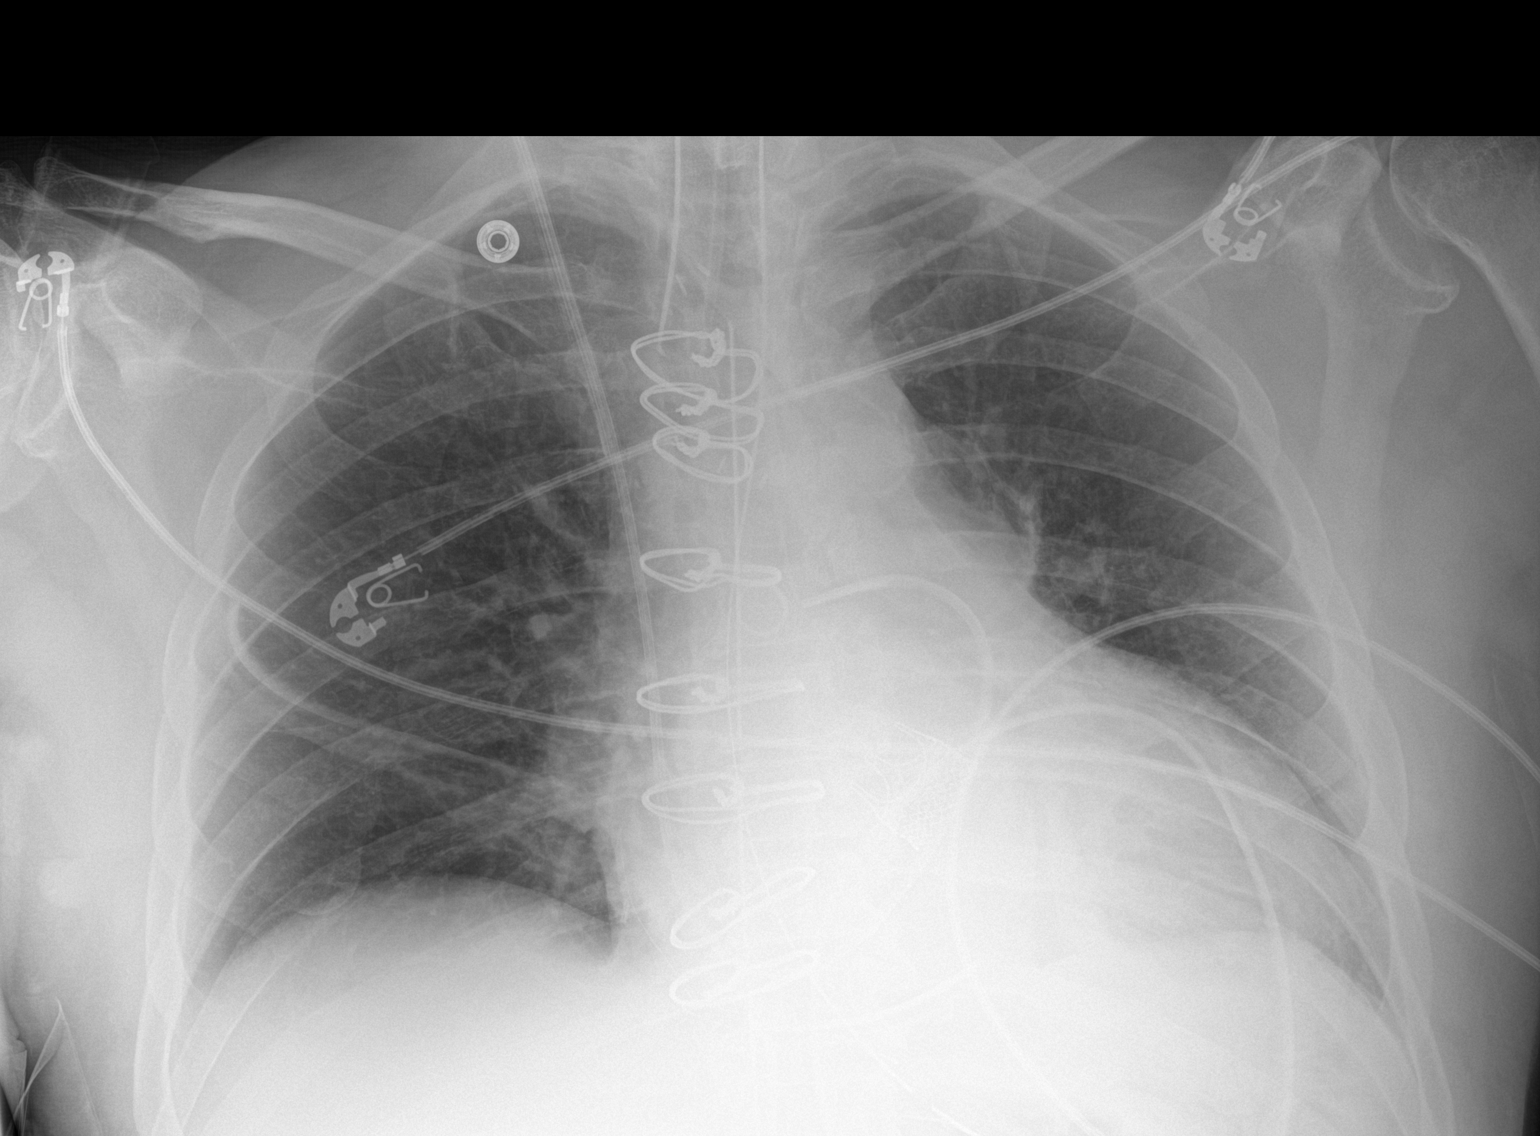

[1 of 1 positions shown; findings below may reference images not displayed]

FINDINGS: Patient is status post median sternotomy 8 mediastinal wires are
intact. Aortic valve replacement is noted. CABG is noted.

Endotracheal tube terminates 5.2 cm of the carina. The side port of
the NG tube is in the stomach. A mediastinal drain is in place. A
right hit sheath -sided chest tube is place. The tip of the
Swan-Ganz catheter is in the right main pulmonary outflow tract.
IMPRESSION: 1. Status post median sternotomy for aortic valve replacement and
CABG.
2. Support apparatus as above.
3. Low lung volumes.

## 2018-06-15 IMAGING — DX DG CHEST 1V PORT
1 series · 1 of 1 positions shown · non-contrast
Comparison: 06/13/2017

CLINICAL DATA: Chest tube.  Aortic valve replacement

EXAM:
PORTABLE CHEST 1 VIEW

[chest ap]
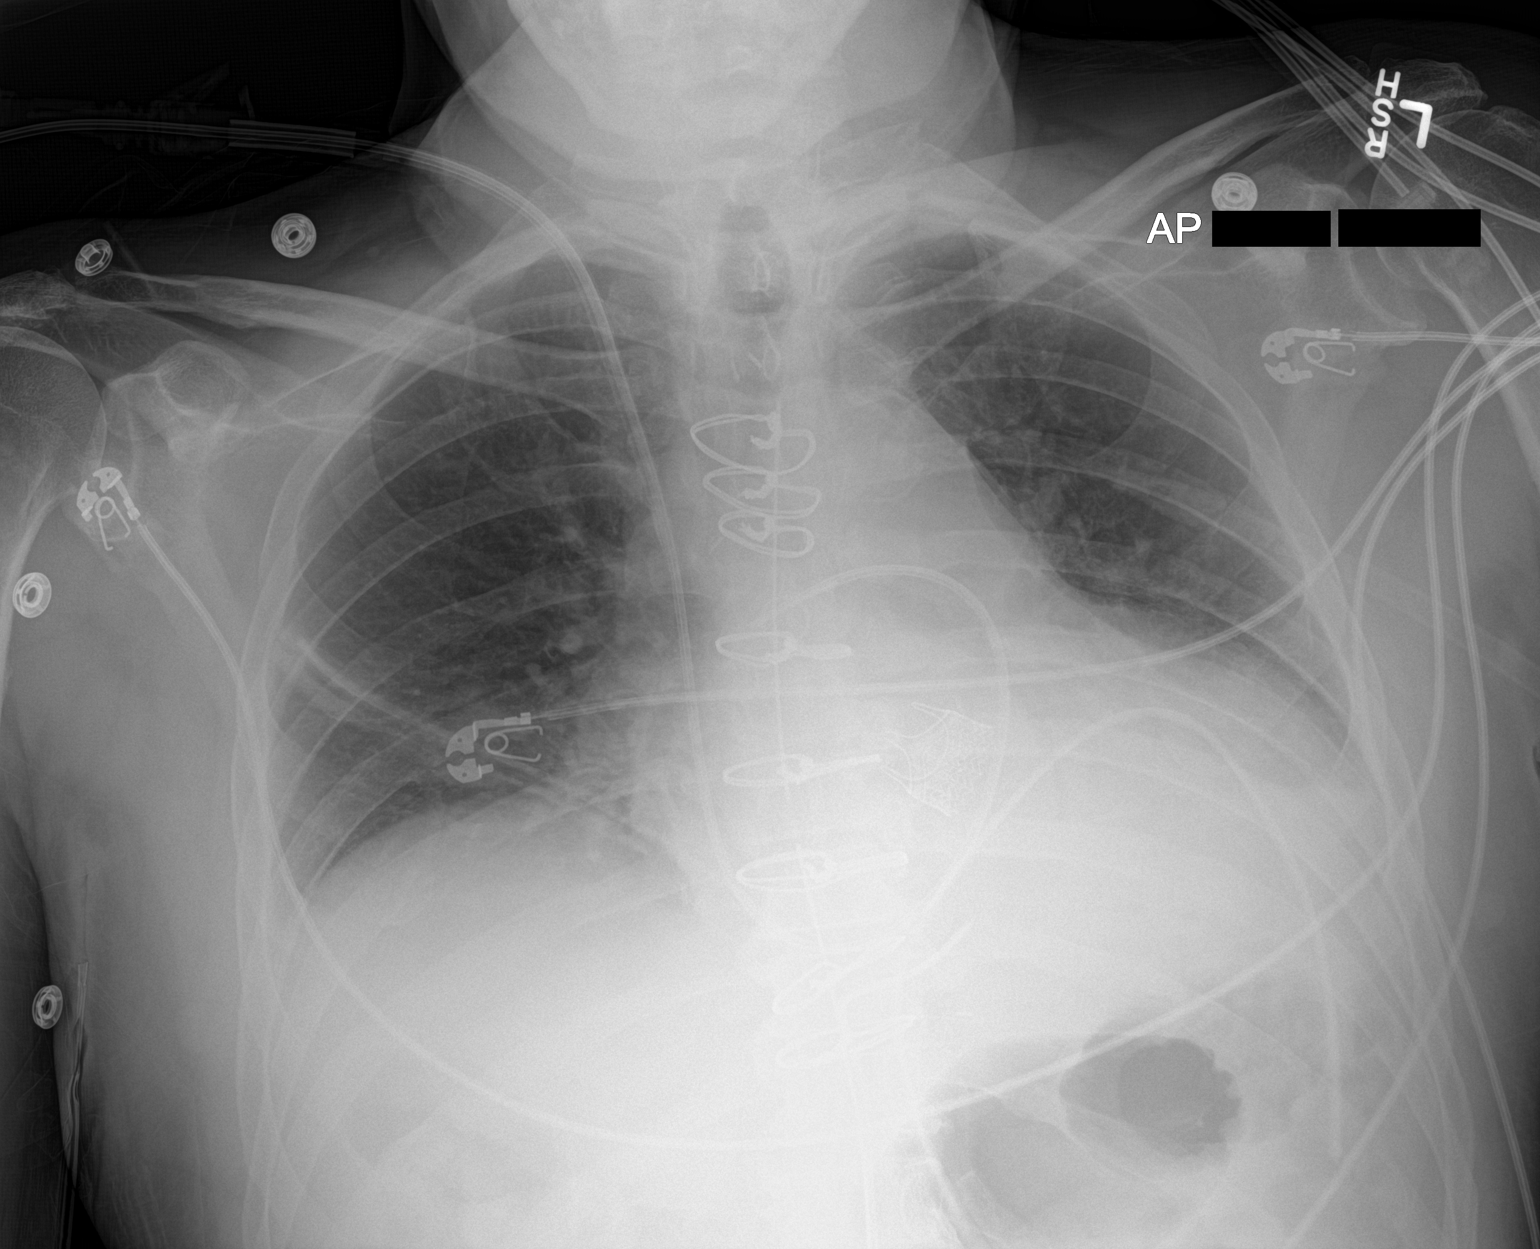

[1 of 1 positions shown; findings below may reference images not displayed]

FINDINGS: Endotracheal tube removed. NG tube removed. Swan-Ganz catheter
remains in the right pulmonary artery. Bilateral chest tubes in
place. No pneumothorax. Aortic valve replacement unchanged.

Hypoventilation with increase in bibasilar atelectasis compared with
yesterday.
IMPRESSION: Hypoventilation with increase in bibasilar atelectasis following
extubation. No pneumothorax.

## 2018-06-17 IMAGING — DX DG CHEST 2V
2 series · 2 of 2 positions shown · non-contrast
Comparison: 06/15/2017.

CLINICAL DATA: Status post CABG and aortic valve replacement on
06/13/2017.

EXAM:
CHEST  2 VIEW

[chest lat]
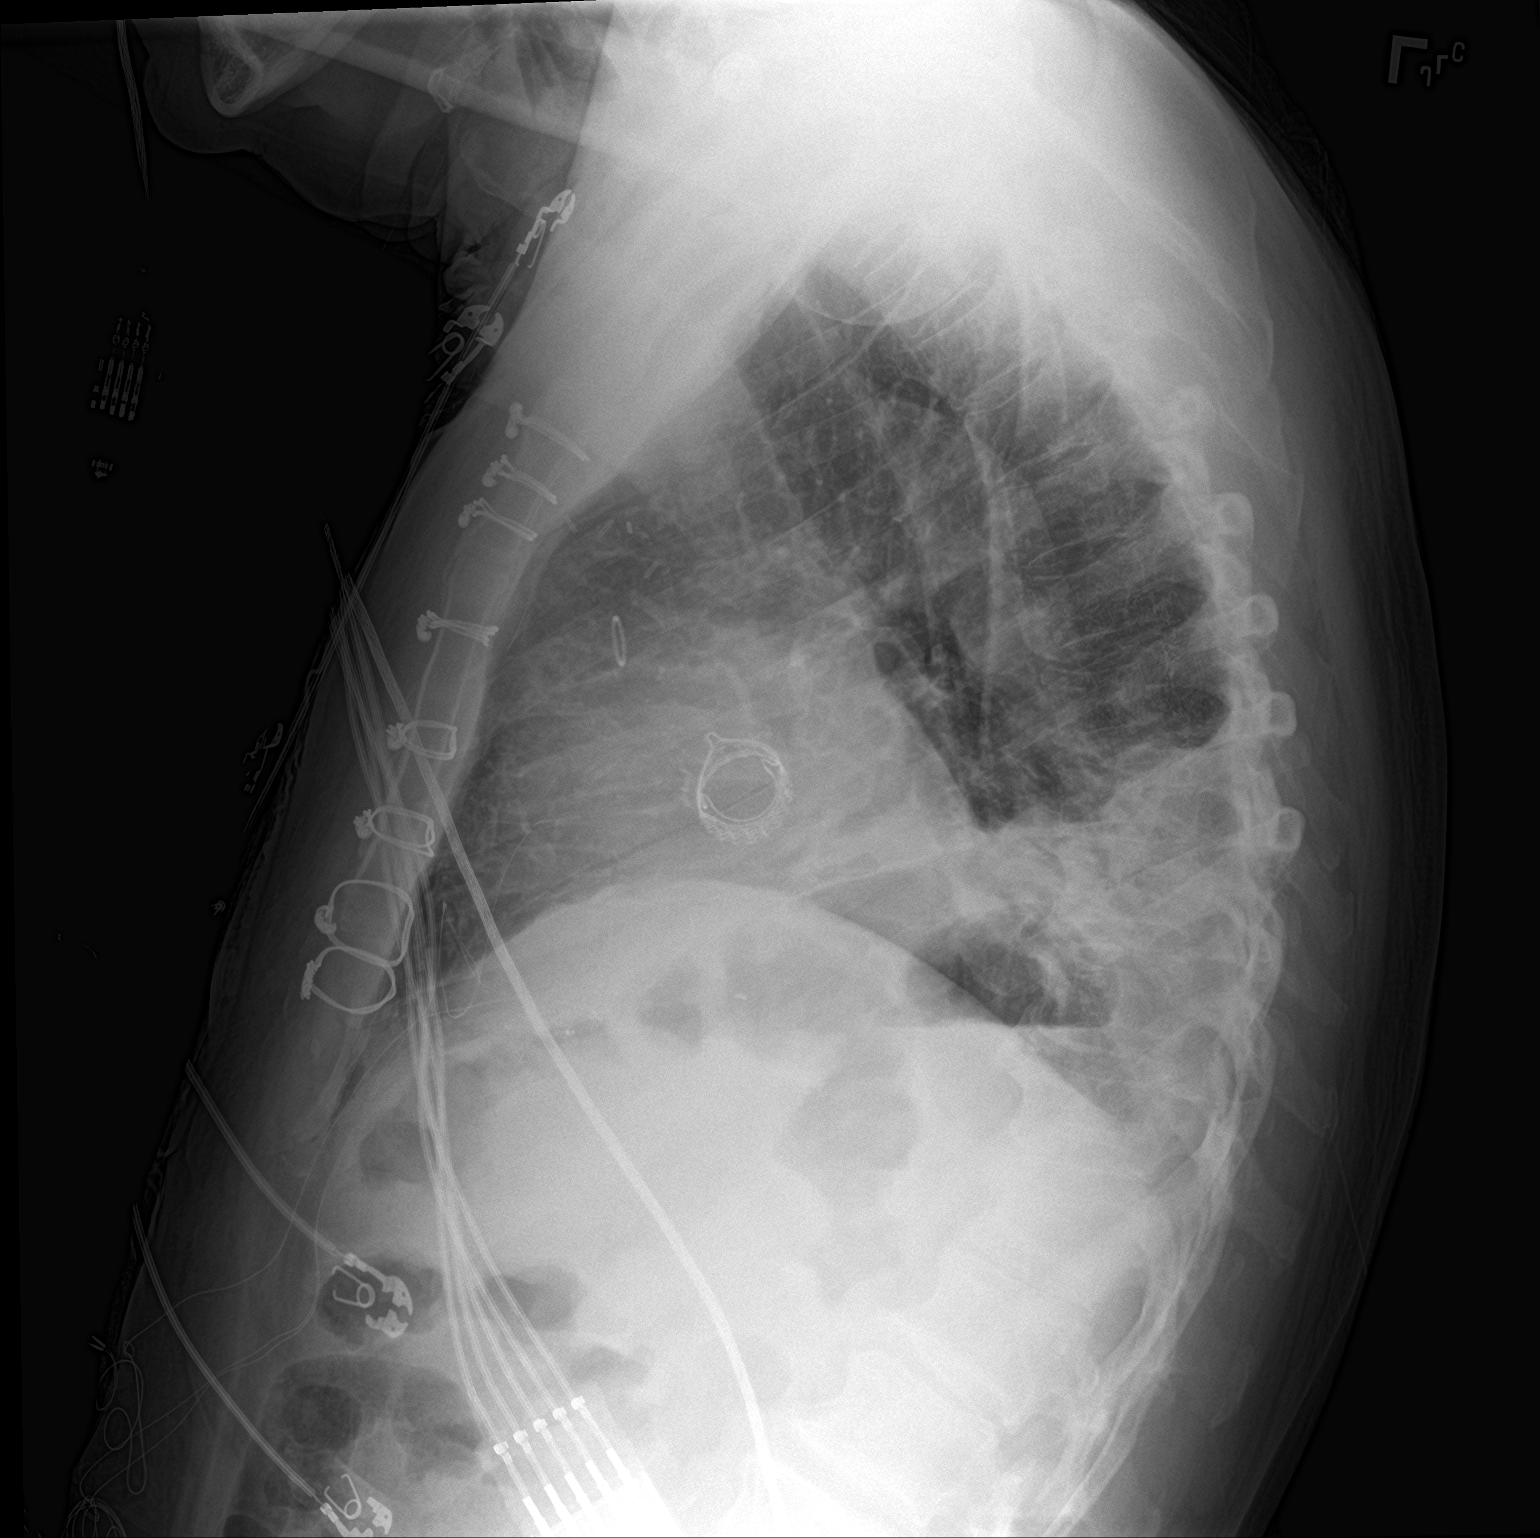

[chest pa]
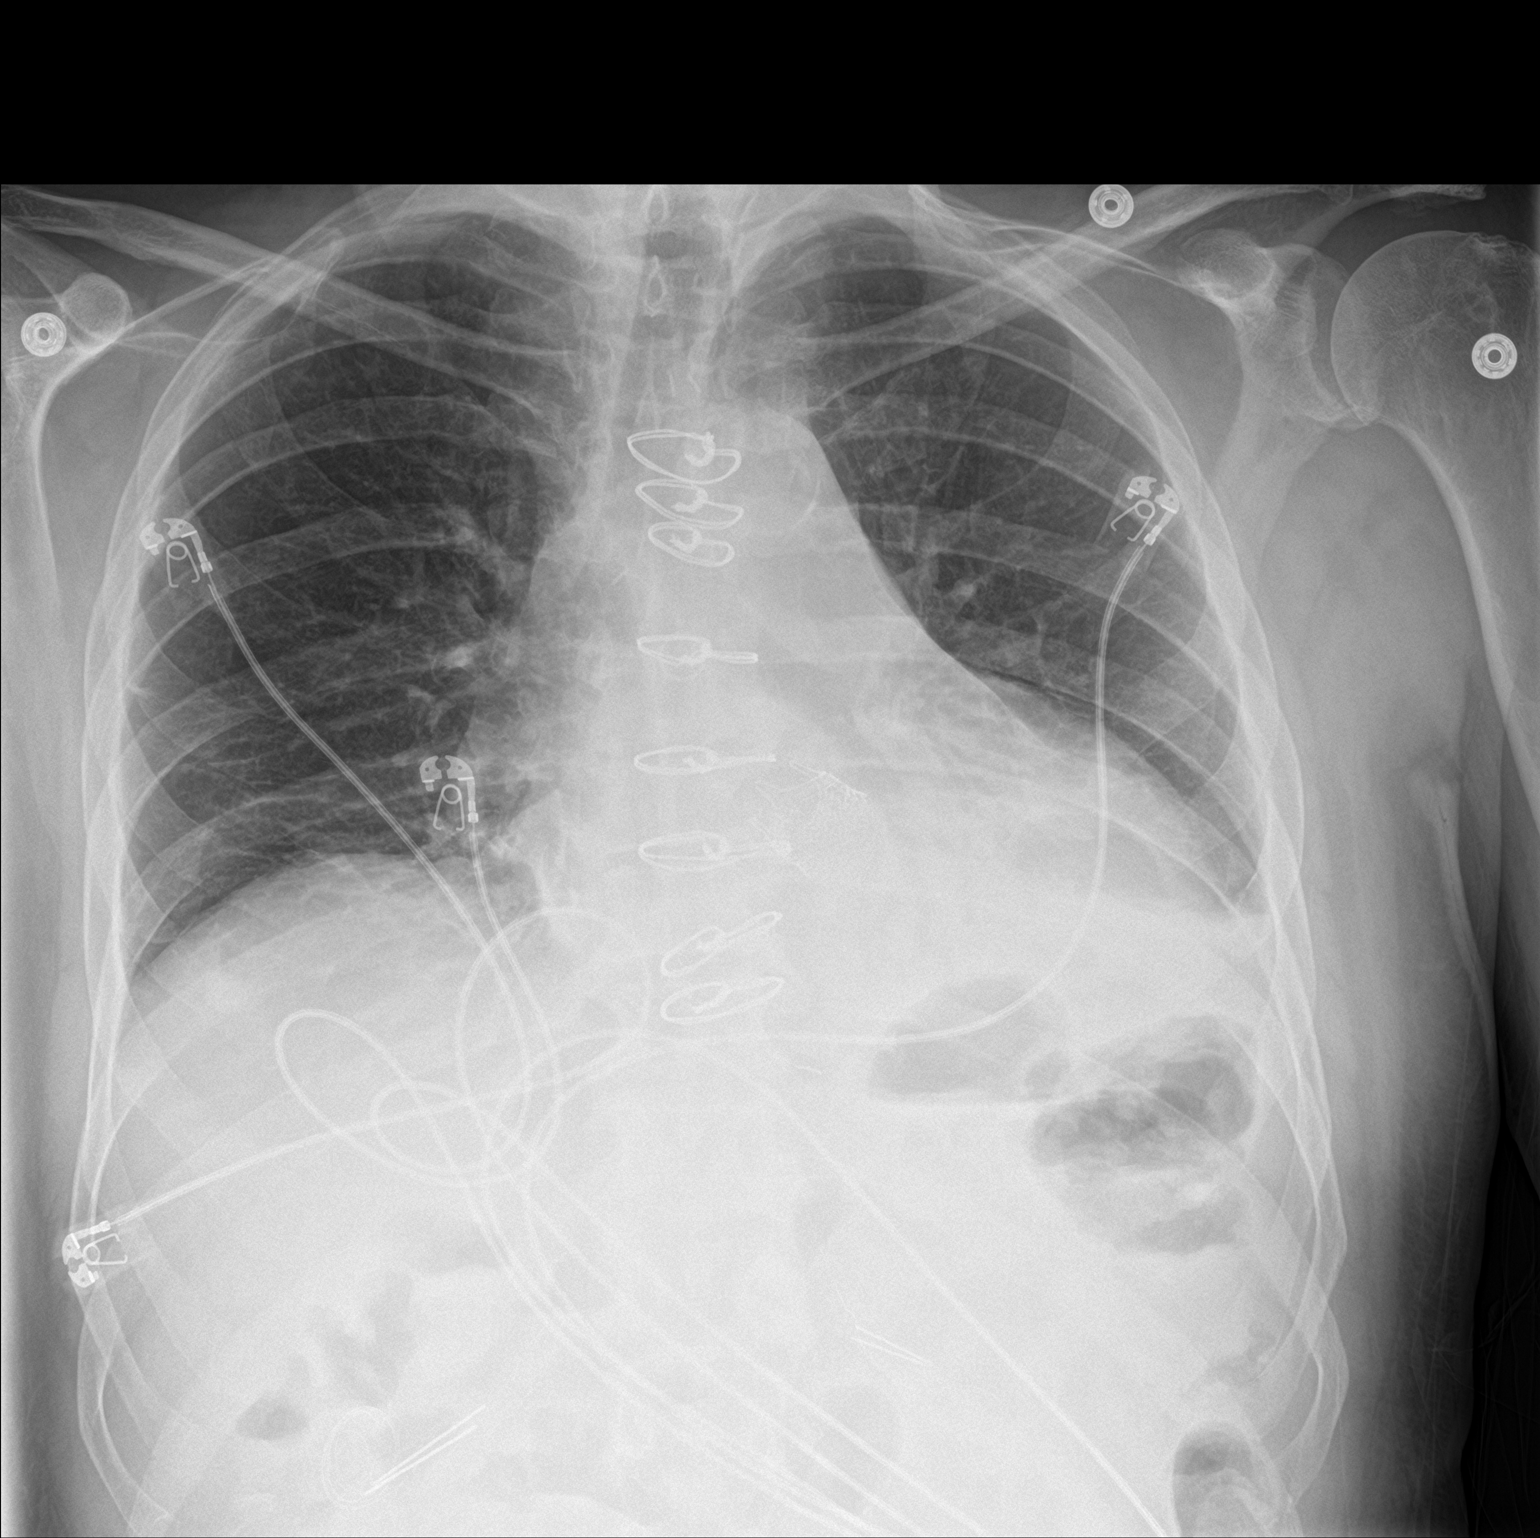

[2 of 2 positions shown; findings below may reference images not displayed]

FINDINGS: Stable enlarged cardiac silhouette, post CABG changes and prosthetic
aortic valve. Mildly improved left lower lobe airspace opacity.
Decreased linear density at the right lung base. Poor inspiration
with improvement. Lower thoracic and upper lumbar spine degenerative
changes.
IMPRESSION: 1. Mildly improved left lower lobe atelectasis and improving minimal
linear atelectasis at the right lung base.
2. Stable cardiomegaly.

## 2019-04-18 DIAGNOSIS — E782 Mixed hyperlipidemia: Secondary | ICD-10-CM | POA: Diagnosis not present

## 2019-04-18 DIAGNOSIS — I10 Essential (primary) hypertension: Secondary | ICD-10-CM | POA: Diagnosis not present

## 2019-04-18 DIAGNOSIS — R600 Localized edema: Secondary | ICD-10-CM | POA: Diagnosis not present

## 2019-04-18 DIAGNOSIS — M1712 Unilateral primary osteoarthritis, left knee: Secondary | ICD-10-CM | POA: Diagnosis not present

## 2019-04-18 DIAGNOSIS — M25572 Pain in left ankle and joints of left foot: Secondary | ICD-10-CM | POA: Diagnosis not present

## 2019-04-18 DIAGNOSIS — E6609 Other obesity due to excess calories: Secondary | ICD-10-CM | POA: Diagnosis not present

## 2022-02-08 ENCOUNTER — Other Ambulatory Visit: Payer: Self-pay

## 2022-02-08 ENCOUNTER — Encounter: Payer: Self-pay | Admitting: Vascular Surgery

## 2022-02-08 ENCOUNTER — Ambulatory Visit (INDEPENDENT_AMBULATORY_CARE_PROVIDER_SITE_OTHER): Payer: Medicare HMO | Admitting: Vascular Surgery

## 2022-02-08 VITALS — BP 134/71 | HR 97 | Temp 98.3°F | Resp 20 | Ht 70.0 in | Wt 230.0 lb

## 2022-02-08 DIAGNOSIS — I70222 Atherosclerosis of native arteries of extremities with rest pain, left leg: Secondary | ICD-10-CM | POA: Diagnosis not present

## 2022-02-08 NOTE — Progress Notes (Signed)
? ?Patient ID: Eugene Frazier, male   DOB: [birth year], 66 y.o.   MRN: [id number] ? ?Reason for Consult: New Patient (Initial Visit) ?  ?Referred by Caryl Bis, MD ? ?Subjective:  ?   ?HPI: ? ?Eugene Frazier is a 66 y.o. male with history of aortic stenosis status post coronary artery bypass grafting.  He also has hypertension and was a smoker but quit many years ago.  He has pain in his legs that he states is combination of neuropathy and osteoarthritis.  He does get cramping in both feet at rest and gets cramping in his calves after walking about 100 yards but he is able to have this resolved after stopping.  He does have new ulceration on the left lateral foot that recently developed an burst.  He states that this did have some drainage.  He denies any fevers or chills.  He denies any previous lower extremity interventions.  He does take aspirin daily.  He denies any personal or family history of abdominal aortic aneurysm disease. ? ?Past Medical History:  ?Diagnosis Date  ? Aortic stenosis   ? Arthritis   ? Chronic systolic (congestive) heart failure (HCC)   ? Coronary artery disease involving native coronary artery of native heart without angina pectoris   ? Dyspnea   ? Heart murmur   ? History of kidney stones   ? Hypertension   ? Myocardial infarction [facility]) 2003  ? S/P aortic valve replacement with bioprosthetic valve 06/13/2017  ? 23 mm Edwards Intuity Elite bovine pericardial rapid deployment bioprosthetic tissue valve  ? ?Family History  ?Problem Relation Age of Onset  ? Alzheimer's disease Mother   ? Lung disease Father   ? ?Past Surgical History:  ?Procedure Laterality Date  ? AORTIC VALVE REPLACEMENT N/A 06/13/2017  ? Procedure: AORTIC VALVE REPLACEMENT (AVR);  Surgeon: Rexene Alberts, MD;  Location: [place];  Service: Open Heart Surgery;  Laterality: N/A;  ? CORONARY ANGIOPLASTY  2003  ? CORONARY ARTERY BYPASS GRAFT N/A 06/13/2017  ? Procedure: CORONARY ARTERY BYPASS GRAFTING (CABG) times one using SVG to  Posterior Descending Artery;  Surgeon: Rexene Alberts, MD;  Location: [place];  Service: Open Heart Surgery;  Laterality: N/A;  ? MULTIPLE EXTRACTIONS WITH ALVEOLOPLASTY N/A 05/31/2017  ? Procedure: Extraction of tooth #'s 2,3,13,14,23,24,26,30 and 31 with alveoloplasty and gross debridement of remaining teeth;  Surgeon: Lenn Cal, DDS;  Location: [place];  Service: Oral Surgery;  Laterality: N/A;  ? RIGHT/LEFT HEART CATH AND CORONARY ANGIOGRAPHY N/A 05/09/2017  ? Procedure: Right/Left Heart Cath and Coronary Angiography;  Surgeon: Sherren Mocha, MD;  Location: [place] CV [facility];  Service: Cardiovascular;  Laterality: N/A;  ? TEE WITHOUT CARDIOVERSION N/A 06/13/2017  ? Procedure: TRANSESOPHAGEAL ECHOCARDIOGRAM (TEE);  Surgeon: Rexene Alberts, MD;  Location: [place];  Service: Open Heart Surgery;  Laterality: N/A;  ? ? ?Short Social History:  ?Social History  ? ?Tobacco Use  ? Smoking status: Former  ?  Packs/day: 2.00  ?  Years: 20.00  ?  Pack years: 40.00  ?  Types: Cigarettes  ?  Quit date: 05/02/1997  ?  Years since quitting: 24.7  ? Smokeless tobacco: Never  ?Substance Use Topics  ? Alcohol use: No  ? ? ?Allergies  ?Allergen Reactions  ? Penicillins Hives and Other (See Comments)  ?  FEVER > 104 degrees ? ?Has patient had a PCN reaction causing immediate rash, facial/tongue/throat swelling, SOB or lightheadedness with hypotension: No ?HAS  PATIENT HAD PCN REACTION WITH SEVERE RASH INVOLVING MUCUS MEMBRANES or SKIN NECROSIS: #  #  #  YES  #  #  #  ?Has patient had a PCN reaction that required hospitalization: No ?Has patient had a PCN reaction occurring within the last 10 years: No ?If all of the above answers are "NO", then may proceed with Cephalosporin use. ?  ? Lipitor [Atorvastatin] Other (See Comments)  ?  Muscle aches  ? ? ?Current Outpatient Medications  ?Medication Sig Dispense Refill  ? acetaminophen (TYLENOL) 325 MG tablet Take 650 mg by mouth every 4 (four) hours as needed for mild pain or  moderate pain.    ? aspirin EC 325 MG EC tablet Take 1 tablet (325 mg total) by mouth daily. 30 tablet 0  ? chlorhexidine (PERIDEX) 0.12 % solution Rinse with 15 mls twice daily for 30 seconds. Use after breakfast and at bedtime. Spit out excess. Do not swallow. 480 mL prn  ? diphenhydrAMINE (BENADRYL) 25 MG tablet Take 25 mg by mouth 2 (two) times daily as needed for itching.     ? furosemide (LASIX) 40 MG tablet Take 40 mg by mouth daily.     ? Ketotifen Fumarate (ALAWAY OP) Apply 1 drop to eye daily as needed (allergies).    ? lisinopril (PRINIVIL,ZESTRIL) 2.5 MG tablet Take 1 tablet (2.5 mg total) by mouth daily. 30 tablet 1  ? metoprolol succinate (TOPROL-XL) 25 MG 24 hr tablet Take 25 mg by mouth daily.    ? potassium chloride SA (K-DUR,KLOR-CON) 20 MEQ tablet Take 1 tablet (20 mEq total) by mouth daily. 30 tablet 1  ? ?No current facility-administered medications for this visit.  ? ? ?Review of Systems  ?Constitutional:  Constitutional negative. ?HENT: HENT negative.  ?Eyes: Eyes negative.  ?Cardiovascular: Positive for claudication.  ?GI: Gastrointestinal negative.  ?GU: Positive for frequency.  ?Musculoskeletal: Positive for leg pain and joint pain.  ?Skin: Positive for wound.  ?Neurological: Positive for numbness.  ?Hematologic: Hematologic/lymphatic negative.  ?Psychiatric: Psychiatric negative.   ? ?   ?Objective:  ?Objective  ? ?Vitals:  ? 02/08/22 1212  ?BP: 134/71  ?Pulse: 97  ?Resp: 20  ?Temp: 98.3 ?F (36.8 ?C)  ?SpO2: 95%  ?Weight: 230 lb (104.3 kg)  ?Height: 5\' 10"  (1.778 m)  ? ?Body mass index is 33 kg/m?. ? ?Physical Exam ?HENT:  ?   Head: Normocephalic.  ?   Nose:  ?   Comments: Wearing a mask ?Eyes:  ?   Pupils: Pupils are equal, round, and reactive to light.  ?Cardiovascular:  ?   Rate and Rhythm: Normal rate.  ?   Pulses:     ?     Femoral pulses are 2+ on the right side and 2+ on the left side. ?     Popliteal pulses are 0 on the right side and 0 on the left side.  ?Pulmonary:  ?   Effort:  Pulmonary effort is normal.  ?Abdominal:  ?   General: Abdomen is flat.  ?   Palpations: Abdomen is soft. There is no mass.  ?Musculoskeletal:     ?   General: Normal range of motion.  ?   Cervical back: Normal range of motion and neck supple.  ?Skin: ?   Capillary Refill: Capillary refill takes 2 to 3 seconds.  ?   Comments: There is a left lateral foot ulcer with punctate central hole but no active drainage and no erythema  ?Neurological:  ?  General: No focal deficit present.  ?   Mental Status: He is alert.  ?Psychiatric:     ?   Mood and Affect: Mood normal.     ?   Behavior: Behavior normal.  ? ? ?Data: ?Recent ABI was 0.59 right 0.89 left with toe index on the right 0.41 and left 0.51 ?    ?Assessment/Plan:  ?  ? ?66 year old male with previous history of coronary artery disease and stable 100 yard claudication.  He does have a new wound on the left lower extremity.  He does not have pulses palpable beyond the common femorals bilaterally.  I discussed with him the need for angiography from a right common femoral approach to evaluate the left lower extremity although the ABI is higher he does have a wound there consistent with chronic limb threatening ischemia.  At this time patient is unsure that he wants to proceed and he will call to schedule.  He will continue aspirin. ? ?  ? ?Waynetta Sandy MD ?[facility] ? ? ?
